# Patient Record
Sex: Female | Born: 1990 | Race: White | Hispanic: No | Marital: Married | Smoking: Never smoker
Health system: Southern US, Community
[De-identification: ages and names within clinical notes are randomized; demographics above are authoritative.]

## PROBLEM LIST (undated history)

## (undated) DIAGNOSIS — F419 Anxiety disorder, unspecified: Secondary | ICD-10-CM

---

## 2008-09-24 ENCOUNTER — Emergency Department: Payer: Self-pay | Admitting: Unknown Physician Specialty

## 2009-04-22 ENCOUNTER — Observation Stay: Payer: Self-pay

## 2009-04-27 ENCOUNTER — Inpatient Hospital Stay: Payer: Self-pay

## 2012-12-29 ENCOUNTER — Emergency Department: Payer: Self-pay | Admitting: Emergency Medicine

## 2013-03-23 ENCOUNTER — Emergency Department: Payer: Self-pay | Admitting: Emergency Medicine

## 2013-11-02 ENCOUNTER — Observation Stay: Payer: Self-pay | Admitting: Obstetrics and Gynecology

## 2013-11-02 LAB — COMPREHENSIVE METABOLIC PANEL
ALBUMIN: 2.9 g/dL — AB (ref 3.4–5.0)
AST: 17 U/L (ref 15–37)
Alkaline Phosphatase: 82 U/L
Anion Gap: 6 — ABNORMAL LOW (ref 7–16)
BUN: 10 mg/dL (ref 7–18)
Bilirubin,Total: 0.3 mg/dL (ref 0.2–1.0)
CALCIUM: 9.2 mg/dL (ref 8.5–10.1)
CO2: 28 mmol/L (ref 21–32)
Chloride: 104 mmol/L (ref 98–107)
Creatinine: 0.48 mg/dL — ABNORMAL LOW (ref 0.60–1.30)
EGFR (African American): >60
EGFR (Non-African Amer.): >60
GLUCOSE: 85 mg/dL (ref 65–99)
Osmolality: 274 (ref 275–301)
Potassium: 3.6 mmol/L (ref 3.5–5.1)
SGPT (ALT): 25 U/L (ref 12–78)
SODIUM: 138 mmol/L (ref 136–145)
Total Protein: 7.2 g/dL (ref 6.4–8.2)

## 2013-11-02 LAB — URINALYSIS, COMPLETE
Bilirubin,UR: NEGATIVE
Blood: NEGATIVE
Glucose,UR: NEGATIVE mg/dL (ref 0–75)
Ketone: NEGATIVE
LEUKOCYTE ESTERASE: NEGATIVE
NITRITE: NEGATIVE
Ph: 7 (ref 4.5–8.0)
Protein: NEGATIVE
SPECIFIC GRAVITY: 1.015 (ref 1.003–1.030)
Squamous Epithelial: 1

## 2013-11-02 LAB — CBC WITH DIFFERENTIAL/PLATELET
Basophil #: 0.1 10*3/uL (ref 0.0–0.1)
Basophil %: 0.7 %
EOS PCT: 0.4 %
Eosinophil #: 0.1 10*3/uL (ref 0.0–0.7)
HCT: 41 % (ref 35.0–47.0)
HGB: 13.9 g/dL (ref 12.0–16.0)
Lymphocyte #: 2.2 10*3/uL (ref 1.0–3.6)
Lymphocyte %: 15.4 %
MCH: 29.5 pg (ref 26.0–34.0)
MCHC: 34 g/dL (ref 32.0–36.0)
MCV: 87 fL (ref 80–100)
MONOS PCT: 4.4 %
Monocyte #: 0.6 x10 3/mm (ref 0.2–0.9)
NEUTROS ABS: 11.2 10*3/uL — AB (ref 1.4–6.5)
NEUTROS PCT: 79.1 %
PLATELETS: 265 10*3/uL (ref 150–440)
RBC: 4.73 10*6/uL (ref 3.80–5.20)
RDW: 13.2 % (ref 11.5–14.5)
WBC: 14.1 10*3/uL — ABNORMAL HIGH (ref 3.6–11.0)

## 2013-11-02 LAB — AMYLASE: Amylase: 67 U/L (ref 25–115)

## 2013-11-02 LAB — LIPASE, BLOOD: LIPASE: 133 U/L (ref 73–393)

## 2013-12-13 ENCOUNTER — Observation Stay: Payer: Self-pay

## 2014-02-17 ENCOUNTER — Observation Stay: Payer: Self-pay

## 2014-03-04 ENCOUNTER — Inpatient Hospital Stay: Payer: Self-pay | Admitting: Obstetrics and Gynecology

## 2014-03-04 LAB — CBC WITH DIFFERENTIAL/PLATELET
Basophil #: 0 10*3/uL (ref 0.0–0.1)
Basophil %: 0.4 %
EOS PCT: 0.5 %
Eosinophil #: 0 10*3/uL (ref 0.0–0.7)
HCT: 38.1 % (ref 35.0–47.0)
HGB: 12.8 g/dL (ref 12.0–16.0)
LYMPHS ABS: 1.8 10*3/uL (ref 1.0–3.6)
LYMPHS PCT: 17.9 %
MCH: 29.4 pg (ref 26.0–34.0)
MCHC: 33.6 g/dL (ref 32.0–36.0)
MCV: 87 fL (ref 80–100)
MONO ABS: 0.6 x10 3/mm (ref 0.2–0.9)
Monocyte %: 6.5 %
NEUTROS PCT: 74.7 %
Neutrophil #: 7.3 10*3/uL — ABNORMAL HIGH (ref 1.4–6.5)
Platelet: 178 10*3/uL (ref 150–440)
RBC: 4.37 10*6/uL (ref 3.80–5.20)
RDW: 13 % (ref 11.5–14.5)
WBC: 9.8 10*3/uL (ref 3.6–11.0)

## 2014-03-04 LAB — GC/CHLAMYDIA PROBE AMP

## 2014-03-05 LAB — HEMATOCRIT: HCT: 35.8 % (ref 35.0–47.0)

## 2014-04-09 ENCOUNTER — Ambulatory Visit: Payer: Self-pay | Admitting: Surgery

## 2014-04-15 ENCOUNTER — Other Ambulatory Visit: Payer: Self-pay | Admitting: Surgery

## 2014-04-15 HISTORY — PX: CHOLECYSTECTOMY: SHX55

## 2014-04-15 LAB — HCG, QUANTITATIVE, PREGNANCY

## 2014-04-16 ENCOUNTER — Ambulatory Visit: Payer: Self-pay | Admitting: Surgery

## 2014-04-29 ENCOUNTER — Ambulatory Visit: Payer: Self-pay | Admitting: Surgery

## 2014-05-03 LAB — PATHOLOGY REPORT

## 2014-07-16 NOTE — L&D Delivery Note (Signed)
Delivery Note At 7:50 PM a viable female was delivered via Vaginal, Spontaneous Delivery (Presentation: Occiput Anterior).  APGAR: 8,9 weight  .   Placenta status: Intact, Spontaneous.  Cord: 3 vessels, No complications.  Anesthesia: Epidural  Episiotomy:  none Lacerations:  1st degree Suture Repair: 3.0 vicryl Est. Blood Loss (mL):  300cc  Mom to postpartum.  Baby to Couplet care / Skin to Skin.  78GN [id number] now s/p NSVD @ 39+1 after augmentation of labor for [facility] w/ severe features. Complete after epidural placed, desire to push. Arrived in room immediately after baby delivered and placed on mom. Delayed cord clamping. 1st degree laceration noted. Repaired with 3-0 vicryl. Placenta delivered spontaneously. 300cc EBL. Will stay on magnesium for 12 hours pp due to [facility] w/ severe features (2 severe range BPs necessitating augmentation). On review of BPs, only 2 severe range pressures and [name] wnl. Asymptomatic. OK for 12hrs. NPO after midnight for pp BTL.   Carly [name] Carly [name] 03/15/2015, 8:20 PM

## 2014-08-06 ENCOUNTER — Emergency Department: Payer: Self-pay | Admitting: Internal Medicine

## 2014-08-06 LAB — CBC
HCT: 40.4 % (ref 35.0–47.0)
HGB: 13.8 g/dL (ref 12.0–16.0)
MCH: 29.2 pg (ref 26.0–34.0)
MCHC: 34.1 g/dL (ref 32.0–36.0)
MCV: 86 fL (ref 80–100)
Platelet: 303 10*3/uL (ref 150–440)
RBC: 4.72 10*6/uL (ref 3.80–5.20)
RDW: 13.9 % (ref 11.5–14.5)
WBC: 7.3 10*3/uL (ref 3.6–11.0)

## 2014-08-06 LAB — HCG, QUANTITATIVE, PREGNANCY: Beta Hcg, Quant.: 90315 m[IU]/mL — ABNORMAL HIGH

## 2014-08-06 LAB — COMPREHENSIVE METABOLIC PANEL
ALT: 23 U/L
AST: 19 U/L (ref 15–37)
Albumin: 3.4 g/dL (ref 3.4–5.0)
Alkaline Phosphatase: 70 U/L
Anion Gap: 6 — ABNORMAL LOW (ref 7–16)
BUN: 10 mg/dL (ref 7–18)
Bilirubin,Total: 0.4 mg/dL (ref 0.2–1.0)
CHLORIDE: 107 mmol/L (ref 98–107)
Calcium, Total: 8.8 mg/dL (ref 8.5–10.1)
Co2: 26 mmol/L (ref 21–32)
Creatinine: 0.66 mg/dL (ref 0.60–1.30)
EGFR (African American): >60
Glucose: 88 mg/dL (ref 65–99)
Osmolality: 276 (ref 275–301)
Potassium: 3.9 mmol/L (ref 3.5–5.1)
Sodium: 139 mmol/L (ref 136–145)
Total Protein: 6.9 g/dL (ref 6.4–8.2)

## 2014-08-06 LAB — URINALYSIS, COMPLETE
BACTERIA: NONE SEEN
BILIRUBIN, UR: NEGATIVE
BLOOD: NEGATIVE
GLUCOSE, UR: NEGATIVE mg/dL (ref 0–75)
Ketone: NEGATIVE
Leukocyte Esterase: NEGATIVE
Nitrite: NEGATIVE
PH: 6 (ref 4.5–8.0)
Protein: NEGATIVE
RBC,UR: NONE SEEN /HPF (ref 0–5)
SPECIFIC GRAVITY: 1.002 (ref 1.003–1.030)
WBC UR: <1 /HPF (ref 0–5)

## 2014-09-22 LAB — OB RESULTS CONSOLE ABO/RH: RH TYPE: POSITIVE

## 2014-09-22 LAB — OB RESULTS CONSOLE HEPATITIS B SURFACE ANTIGEN: Hepatitis B Surface Ag: NEGATIVE

## 2014-09-22 LAB — OB RESULTS CONSOLE RUBELLA ANTIBODY, IGM: RUBELLA: IMMUNE

## 2014-09-22 LAB — OB RESULTS CONSOLE VARICELLA ZOSTER ANTIBODY, IGG: VARICELLA IGG: IMMUNE

## 2014-09-22 LAB — OB RESULTS CONSOLE ANTIBODY SCREEN: ANTIBODY SCREEN: NEGATIVE

## 2014-09-22 LAB — OB RESULTS CONSOLE HIV ANTIBODY (ROUTINE TESTING): HIV: NONREACTIVE

## 2014-11-06 NOTE — Op Note (Signed)
PATIENT NAME:  Carly [name], Carly [name] MR#:  [id number] DATE OF BIRTH:  1990/11/15  DATE OF PROCEDURE:  04/29/2014  PREOPERATIVE DIAGNOSIS: Chronic cholecystitis.   POSTOPERATIVE DIAGNOSIS:   Chronic cholecystitis.  PROCEDURE: Laparoscopic cholecystectomy.   SURGEON:  Renda RollsWilton Smith, M.D.   ANESTHESIA: General.   INDICATIONS: This 24 year old female has a history of right upper quadrant abdominal pains. Ultrasound was with nonvisualization of the gallbladder and hepatobiliary scan demonstrated no gallbladder function indicating chronic cholecystitis. Surgery was recommended for definitive treatment.   DESCRIPTION OF PROCEDURE: The patient was placed on the operating table in the supine position under general endotracheal anesthesia. The abdomen was prepared with ChloraPrep and draped in a sterile manner.   A short incision was made in the inferior aspect of the umbilicus and carried down to the deep fascia which was grasped with laryngeal hook and elevated. A Veress needle was inserted, aspirated, and irrigated with a saline solution. Next, the peritoneal cavity was inflated with carbon dioxide. The Veress needle was removed. The 10 mm cannula was inserted. The 10 mm 0 degree laparoscope was inserted to view the peritoneal cavity. The liver appeared normal. Brief survey revealed typical appearance of the intestines and stomach. Another incision was made in the epigastrium slightly to the right of the midline to introduce an 11 mm cannula. Two incisions were made in the lateral aspect of the right upper quadrant to introduce two 5 mm cannulas.   The gallbladder was found to be contracted and was retracted towards the right shoulder. The infundibulum was retracted inferiorly and laterally. The porta hepatis was inspected. The gallbladder neck was mobilized with incision of the visceral peritoneum. The cystic duct was dissected free from surrounding structures. The cystic artery was dissected free from  surrounding structures. A critical view of safety was demonstrated. An Endo Clip was placed across the cystic duct adjacent to the neck of the gallbladder. An incision was made in the cystic duct to introduce a Reddick catheter; however, the catheter would not thread.  The cystic duct appeared to be small in size and the cholangiogram was therefore not done. The Reddick catheter was removed. The cystic duct was doubly ligated with Endoclips and divided. The cystic artery was controlled with double Endoclips and divided. The gallbladder was dissected free from the liver with hook and cautery. Bleeding was very minimal. Hemostasis was subsequently intact. The gallbladder was brought out through the infraumbilical incision and was found to be contracted and firm and submitted in formalin for routine pathology. The right upper quadrant was further inspected. Hemostasis was intact. The cannulas were removed allowing carbon dioxide to escape from the peritoneal cavity. There was some oozing from the lower-most 5 mm port site and all 4  incisions were infiltrated with 0.5% Sensorcaine with epinephrine. Pressure was held over the port site. There was no bleeding point immediately beneath the skin and with time hemostasis was subsequently intact. The wounds were closed with interrupted 5-0 chromic subcuticular suture, benzoin, and Steri-Strips. Dressings were applied with paper tape. The patient tolerated surgery satisfactorily and was prepared for transfer to the recovery room.    ____________________________ Shela CommonsJ. Renda RollsWilton Smith, MD jws:jw D: 04/29/2014 13:40:40 ET T: 04/29/2014 14:34:47 ET JOB#: [id number]  cc: Adella HareJ. Wilton Smith, MD, <Dictator> Adella HareWILTON J SMITH MD ELECTRONICALLY SIGNED 05/07/2014 19:41

## 2014-11-23 NOTE — H&P (Signed)
L&D Evaluation:  History:  HPI 24 y/o [facility]2P1001 [facility] arrives last night with c/o RUQ pain after eating grilled chicken salad with ranch dressing. Vomited x1 at home after eating. Was seen 3wks ago for same sx DX Gall bladder sludge via US ( no stones present). Has been taking zantac bid and watching nutrition (avoiding greasy fried fatty irritating foods). PNC @ [facility]-seen this past week. 28 wk labs scheduled next week. FM+, denies leaking fulid, contractions or vaginal bleeding, baby is active. No Pre/e sx normal blood pressure, no visual disturbances or headache. HX pre/e IOL @ 38wks, obesity, anxiety.   Presents with abdominal pain   Patient's Medical History No Chronic Illness   Patient's Surgical History none   Medications Pre Natal Vitamins   Allergies NKDA   Social History none   Family History Non-Contributory   ROS:  ROS All systems were reviewed.  HEENT, CNS, GI, GU, Respiratory, CV, Renal and Musculoskeletal systems were found to be normal.   Exam:  Vital Signs stable   Urine Protein negative dipstick   General no apparent distress   Mental Status clear   Chest clear   Heart normal sinus rhythm   Abdomen gravid, non-tender   Estimated Fetal Weight Average for gestational age   Fundal Height appropriate   Back no CVAT   Edema no edema   Reflexes 1+   Clonus negative   Pelvic no exam   Mebranes Intact   FHT baseline 130's 140's avg variabililty with accels   Ucx absent   Skin dry   Lymph no lymphadenopathy   Impression:  Impression 27wks Gall Bladder pain   Plan:  Plan UA, EFM/NST   Comments Pain resolved after 1 Norco given, pt slept through the night well. Discussed need to watch nutrition, avoid irritating foods, continue with zantac, and follow up with [facility]. Explained balancing discomfort while planning for term delivery. Will refer to GI/General Surgery as needed. Norco 5/325 #20 to be used sparingly prn. Safety  precautions given. Knows kick counts and when to call.   Electronic Signatures: Albertina ParrLugiano, Talmage Teaster B (CNM)  (Signed 31-May-15 10:06)  Authored: L&D Evaluation   Last Updated: 31-May-15 10:06 by Albertina ParrLugiano, Erastus Bartolomei B (CNM)

## 2014-11-23 NOTE — H&P (Signed)
L&D Evaluation:  History:  HPI 23yo G2P1001 with LMP of 06/04/13 & EDD of 03/11/14 for IOL for cholecystitis and pain in the RUQ. Pt is feeling well today and no pain right now. set up for IOL by A.Lugianno, CNM. 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 No VB,decreased FM, ROM or UC's.   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  HEENT, CNS, GI, GU, Respiratory, CV, Renal and Musculoskeletal systems were found to be normal.   Exam:  Vital Signs stable  128/86 this am   General no apparent distress   Mental Status clear   Chest clear   Heart normal sinus rhythm, no murmur/gallop/rubs   Abdomen gravid, non-tender   Estimated Fetal Weight Average for gestational age   Fetal Position vtx   Back no CVAT   Reflexes 1+   Pelvic 3/80/vtx-2 very post cx   Mebranes Ruptured, clear   Description clear   FHT normal rate with no decels   Ucx absent   Skin dry   Lymph no lymphadenopathy   Impression:  Impression IUP at term admittted for IOL for cholecystitis   Plan:  Plan monitor contractions and for cervical change, GBS neg   Comments Pain in RUQ controlled today, Pt has sludge in GB. Will start Pitocin. Dr Feliberto GottronSchermerhorn aware of pt status and agrees with plan of care.   Electronic Signatures: Carly [name], Carly [name] (CNM)  (Signed 20-Aug-15 10:24)  Authored: L&D Evaluation   Last Updated: 20-Aug-15 10:24 by Carly [name], Carly [name] (CNM)

## 2015-02-23 LAB — OB RESULTS CONSOLE GC/CHLAMYDIA
Chlamydia: NEGATIVE
GC PROBE AMP, GENITAL: NEGATIVE

## 2015-02-23 LAB — OB RESULTS CONSOLE GBS: GBS: POSITIVE

## 2015-03-15 ENCOUNTER — Inpatient Hospital Stay: Payer: Medicaid Other | Admitting: Anesthesiology

## 2015-03-15 ENCOUNTER — Inpatient Hospital Stay
Admission: EM | Admit: 2015-03-15 | Discharge: 2015-03-17 | DRG: 775 | Disposition: A | Discharge status: Home / Self Care | Payer: Medicaid Other | Attending: Obstetrics and Gynecology | Admitting: Obstetrics and Gynecology

## 2015-03-15 ENCOUNTER — Encounter: Payer: Self-pay | Admitting: *Deleted

## 2015-03-15 DIAGNOSIS — Z9049 Acquired absence of other specified parts of digestive tract: Secondary | ICD-10-CM | POA: Diagnosis present

## 2015-03-15 DIAGNOSIS — Z3A39 39 weeks gestation of pregnancy: Secondary | ICD-10-CM | POA: Diagnosis present

## 2015-03-15 DIAGNOSIS — O139 Gestational [pregnancy-induced] hypertension without significant proteinuria, unspecified trimester: Secondary | ICD-10-CM | POA: Diagnosis present

## 2015-03-15 DIAGNOSIS — O1414 Severe pre-eclampsia complicating childbirth: Secondary | ICD-10-CM

## 2015-03-15 HISTORY — DX: Anxiety disorder, unspecified: F41.9

## 2015-03-15 LAB — PROTEIN / CREATININE RATIO, URINE
Creatinine, Urine: 305 mg/dL
Protein Creatinine Ratio: 0.21 mg/mg{Cre} — ABNORMAL HIGH (ref 0.00–0.15)
Total Protein, Urine: 65 mg/dL

## 2015-03-15 LAB — COMPREHENSIVE METABOLIC PANEL
ALK PHOS: 212 U/L — AB (ref 38–126)
ALT: 23 U/L (ref 14–54)
ANION GAP: 10 (ref 5–15)
AST: 23 U/L (ref 15–41)
Albumin: 2.9 g/dL — ABNORMAL LOW (ref 3.5–5.0)
BILIRUBIN TOTAL: 0.6 mg/dL (ref 0.3–1.2)
BUN: 9 mg/dL (ref 6–20)
CALCIUM: 8.9 mg/dL (ref 8.9–10.3)
CO2: 21 mmol/L — ABNORMAL LOW (ref 22–32)
CREATININE: 0.52 mg/dL (ref 0.44–1.00)
Chloride: 107 mmol/L (ref 101–111)
GFR calc non Af Amer: >60 mL/min (ref >60)
GLUCOSE: 78 mg/dL (ref 65–99)
Potassium: 3.8 mmol/L (ref 3.5–5.1)
Sodium: 138 mmol/L (ref 135–145)
TOTAL PROTEIN: 6.6 g/dL (ref 6.5–8.1)

## 2015-03-15 LAB — CBC
HEMATOCRIT: 40 % (ref 35.0–47.0)
HEMOGLOBIN: 13.4 g/dL (ref 12.0–16.0)
MCH: 28.6 pg (ref 26.0–34.0)
MCHC: 33.4 g/dL (ref 32.0–36.0)
MCV: 85.4 fL (ref 80.0–100.0)
Platelets: 151 10*3/uL (ref 150–440)
RBC: 4.69 MIL/uL (ref 3.80–5.20)
RDW: 13.9 % (ref 11.5–14.5)
WBC: 8.7 10*3/uL (ref 3.6–11.0)

## 2015-03-15 LAB — TYPE AND SCREEN
ABO/RH(D): A POS
ANTIBODY SCREEN: NEGATIVE

## 2015-03-15 LAB — ABO/RH: ABO/RH(D): A POS

## 2015-03-15 LAB — URIC ACID: Uric Acid, Serum: 5 mg/dL (ref 2.3–6.6)

## 2015-03-15 MED ORDER — EPHEDRINE SULFATE 50 MG/ML IJ SOLN
INTRAMUSCULAR | Status: AC
  Administered 2015-03-15: 15 mg
  Filled 2015-03-15: qty 1

## 2015-03-15 MED ORDER — SODIUM CHLORIDE 0.9 % IJ SOLN
INTRAMUSCULAR | Status: AC
  Filled 2015-03-15: qty 6

## 2015-03-15 MED ORDER — BUPIVACAINE HCL (PF) 0.25 % IJ SOLN
INTRAMUSCULAR | Status: AC | PRN
  Administered 2015-03-15: 4 mL

## 2015-03-15 MED ORDER — FENTANYL 2.5 MCG/ML W/ROPIVACAINE 0.2% IN NS 100 ML EPIDURAL INFUSION (ARMC-ANES): 9.0000 mL/h | EPIDURAL | Status: AC

## 2015-03-15 MED ORDER — MAGNESIUM SULFATE 4 GM/100ML IV SOLN
4.0000 g | Freq: Once | INTRAVENOUS | Status: AC
  Administered 2015-03-15: 4 g via INTRAVENOUS
  Filled 2015-03-15: qty 100

## 2015-03-15 MED ORDER — MISOPROSTOL 200 MCG PO TABS
ORAL_TABLET | ORAL | Status: AC
  Filled 2015-03-15: qty 4

## 2015-03-15 MED ORDER — PHENYLEPHRINE 40 MCG/ML (10ML) SYRINGE FOR IV PUSH (FOR BLOOD PRESSURE SUPPORT)
80.0000 ug | PREFILLED_SYRINGE | INTRAVENOUS | Status: DC | PRN
  Filled 2015-03-15: qty 2

## 2015-03-15 MED ORDER — LACTATED RINGERS IV SOLN: 500.0000 mL | INTRAVENOUS | Status: DC | PRN

## 2015-03-15 MED ORDER — DIPHENHYDRAMINE HCL 50 MG/ML IJ SOLN: 12.5000 mg | INTRAMUSCULAR | Status: DC | PRN

## 2015-03-15 MED ORDER — SODIUM CHLORIDE 0.9 % IJ SOLN
INTRAMUSCULAR | Status: AC
  Filled 2015-03-15: qty 10

## 2015-03-15 MED ORDER — OXYTOCIN 10 UNIT/ML IJ SOLN
INTRAMUSCULAR | Status: AC
  Filled 2015-03-15: qty 2

## 2015-03-15 MED ORDER — EPHEDRINE 5 MG/ML INJ
10.0000 mg | INTRAVENOUS | Status: DC | PRN
  Filled 2015-03-15: qty 2

## 2015-03-15 MED ORDER — LABETALOL HCL 5 MG/ML IV SOLN: 20.0000 mg | INTRAVENOUS | Status: DC | PRN

## 2015-03-15 MED ORDER — AMMONIA AROMATIC IN INHA
RESPIRATORY_TRACT | Status: AC
  Filled 2015-03-15: qty 10

## 2015-03-15 MED ORDER — FENTANYL 2.5 MCG/ML W/ROPIVACAINE 0.2% IN NS 100 ML EPIDURAL INFUSION (ARMC-ANES)
EPIDURAL | Status: AC
  Administered 2015-03-15: 9 mL/h via EPIDURAL
  Filled 2015-03-15: qty 100

## 2015-03-15 MED ORDER — MAGNESIUM SULFATE 50 % IJ SOLN
2.0000 g/h | INTRAVENOUS | Status: DC
  Administered 2015-03-15: 2 g/h via INTRAVENOUS
  Filled 2015-03-15: qty 80

## 2015-03-15 MED ORDER — LIDOCAINE-EPINEPHRINE (PF) 1.5 %-1:200000 IJ SOLN
INTRAMUSCULAR | Status: AC | PRN
  Administered 2015-03-15: 4 mL via EPIDURAL

## 2015-03-15 MED ORDER — PENICILLIN G POTASSIUM 5000000 UNITS IJ SOLR
5.0000 10*6.[IU] | Freq: Once | INTRAMUSCULAR | Status: AC
  Administered 2015-03-15: 5 10*6.[IU] via INTRAVENOUS
  Filled 2015-03-15: qty 5

## 2015-03-15 MED ORDER — LIDOCAINE HCL (PF) 1 % IJ SOLN
INTRAMUSCULAR | Status: AC
  Administered 2015-03-15: 2 mL via SUBCUTANEOUS
  Filled 2015-03-15: qty 30

## 2015-03-15 MED ORDER — TERBUTALINE SULFATE 1 MG/ML IJ SOLN: 0.2500 mg | Freq: Once | INTRAMUSCULAR | Status: DC | PRN

## 2015-03-15 MED ORDER — PENICILLIN G POTASSIUM 5000000 UNITS IJ SOLR
2.5000 10*6.[IU] | INTRAMUSCULAR | Status: DC
  Administered 2015-03-15: 2.5 10*6.[IU] via INTRAVENOUS
  Filled 2015-03-15 (×6): qty 2.5

## 2015-03-15 MED ORDER — OXYTOCIN 40 UNITS IN LACTATED RINGERS INFUSION - SIMPLE MED
1.0000 m[IU]/min | INTRAVENOUS | Status: DC
  Administered 2015-03-15: 1 m[IU]/min via INTRAVENOUS
  Administered 2015-03-15: 40 m[IU]/min via INTRAVENOUS
  Administered 2015-03-16: 66.667 m[IU]/min via INTRAVENOUS

## 2015-03-15 MED ORDER — LACTATED RINGERS IV SOLN
INTRAVENOUS | Status: DC
  Administered 2015-03-15 (×2): via INTRAVENOUS

## 2015-03-15 MED ORDER — OXYTOCIN 40 UNITS IN LACTATED RINGERS INFUSION - SIMPLE MED
INTRAVENOUS | Status: AC
  Administered 2015-03-15: 1 m[IU]/min via INTRAVENOUS
  Filled 2015-03-15: qty 1000

## 2015-03-15 MED ORDER — CALCIUM GLUCONATE 10 % IV SOLN
INTRAVENOUS | Status: AC
  Filled 2015-03-15: qty 10

## 2015-03-15 NOTE — H&P (Signed)
Carly [name] is a 24 y.o. female presenting for elevated BP in office 120's / 100's . Mild h/a . Up on L+D diastolic BP 111.   PIH in first pregnancy . EGA 39+1 weeks  History OB History    Gravida Para Term Preterm AB TAB SAB Ectopic Multiple Living   Past Medical History  Diagnosis Date  . Anxiety    Past Surgical History  Procedure Laterality Date  . Cholecystectomy  October 2015   Family History: family history is not on file. Social History:  reports that she has never smoked. She does not have any smokeless tobacco history on file. She reports that she does not drink alcohol or use illicit drugs.   Prenatal Transfer Tool  Maternal Diabetes: No Genetic Screening: Normal, afp tetra . Too late for first trimester testing  Maternal Ultrasounds/Referrals: Normal Fetal Ultrasounds or other Referrals:  None Maternal Substance Abuse:  No Significant Maternal Medications:  None Significant Maternal Lab Results:  None Other Comments:  None  ROS unremarkable     Blood pressure 146/111, pulse 101, height  (1.626 m), weight 225 lb (102.059 kg). Exam  Lungs CTA  No rales  CV rrr without murmur adbsoft NT  Gravid  CX 3 cm / 60 /-2 /vtx  NST : 140's  + accels , no decels . No CTX  Physical Exam  Prenatal labs: ABO, Rh: --/--/A POS (08/30 1111) Antibody: NEG (08/30 1111) Rubella: Immune (03/09 0000) RPR:    HBsAg: Negative (03/09 0000)  HIV: Non-reactive (03/09 0000)  GBS: Positive (08/10 0000)   Assessment/Plan: PIH with severe BP criteria  Induce labor with Pitocin . Magnesium sulfate 4 gm load and 2 gm / hr  PCN  GBS prophylaxis    Carly [name] 03/15/2015, 1:35 PM

## 2015-03-15 NOTE — Anesthesia Procedure Notes (Signed)
Epidural Patient location during procedure: OB  Preanesthetic Checklist Completed: patient identified, site marked, surgical consent, pre-op evaluation, timeout performed, IV checked, risks and benefits discussed and monitors and equipment checked  Epidural Patient position: sitting Prep: Betadine Patient monitoring: heart rate, continuous pulse ox and blood pressure Approach: midline Location: L4-L5 Injection technique: LOR air  Needle:  Needle type: Tuohy  Needle gauge: 18 G Needle length: 9 cm and 9 Needle insertion depth: 5 cm Catheter type: closed end flexible Catheter size: 20 Guage Catheter at skin depth: 10 cm Test dose: negative and 1.5% lidocaine with Epi 1:200 K  Assessment Sensory level: T10 Events: blood not aspirated, injection not painful, no injection resistance, negative IV test and no paresthesia  Additional Notes Pt's history reviewed and consent obtained as per OB consent Patient tolerated the insertion well without complications. Negative SATD, negative IVTD All VSS were obtained and monitored through OBIX and nursing protocols followed.Reason for block:procedure for pain   

## 2015-03-15 NOTE — Anesthesia Preprocedure Evaluation (Signed)
Anesthesia Evaluation  Patient identified by MRN, date of birth, ID band Patient awake    Reviewed: Allergy & Precautions, H&P , NPO status , Patient's Chart, lab work & pertinent test results, reviewed documented beta blocker date and time   Airway Mallampati: III  TM Distance: >3 FB Neck ROM: full    Dental no notable dental hx. (+) Teeth Intact   Pulmonary neg pulmonary ROS,  breath sounds clear to auscultation  Pulmonary exam normal       Cardiovascular Exercise Tolerance: Good negative cardio ROS Normal cardiovascular examRhythm:regular Rate:Normal     Neuro/Psych negative neurological ROS  negative psych ROS   GI/Hepatic negative GI ROS, Neg liver ROS,   Endo/Other  negative endocrine ROS  Renal/GU negative Renal ROS  negative genitourinary   Musculoskeletal   Abdominal   Peds  Hematology negative hematology ROS (+)   Anesthesia Other Findings   Reproductive/Obstetrics (+) Pregnancy                             Anesthesia Physical Anesthesia Plan  ASA: II and emergent  Anesthesia Plan: Regional and Epidural   Post-op Pain Management:    Induction:   Airway Management Planned:   Additional Equipment:   Intra-op Plan:   Post-operative Plan:   Informed Consent: I have reviewed the patients History and Physical, chart, labs and discussed the procedure including the risks, benefits and alternatives for the proposed anesthesia with the patient or authorized representative who has indicated his/her understanding and acceptance.     Plan Discussed with: CRNA  Anesthesia Plan Comments:         Anesthesia Quick Evaluation

## 2015-03-15 NOTE — Progress Notes (Signed)
Patient ID: Carly [name]  DOB: January 28, 1991, 24 y.o.   MRN: [id number] Transient fetal bradycardia at 1520 secondary to low BP s/p Magnesium bolus .  Currently on 2 gm / he . Pitocin at 80mu/min  Ctx q 5-8 minutes . Reassurring fetal monitoring now  BP 150/90's CX AROM - clear . 4cm/ 80 /-1 . FSE placed  Second dose on PCN soon . Anticipate SVD . Dr Tildon Husky coming on at 1900

## 2015-03-15 NOTE — Progress Notes (Signed)
15 mg ephedrine given per Dr. Pernell Dupre

## 2015-03-16 ENCOUNTER — Inpatient Hospital Stay: Payer: Medicaid Other | Admitting: *Deleted

## 2015-03-16 ENCOUNTER — Encounter
Admission: EM | Disposition: A | Discharge status: Home / Self Care | Payer: Self-pay | Source: Home / Self Care | Attending: Obstetrics and Gynecology

## 2015-03-16 HISTORY — PX: TUBAL LIGATION: SHX77

## 2015-03-16 LAB — COMPREHENSIVE METABOLIC PANEL
ALBUMIN: 2.3 g/dL — AB (ref 3.5–5.0)
ALT: 27 U/L (ref 14–54)
ANION GAP: 6 (ref 5–15)
AST: 29 U/L (ref 15–41)
Alkaline Phosphatase: 172 U/L — ABNORMAL HIGH (ref 38–126)
BILIRUBIN TOTAL: 0.7 mg/dL (ref 0.3–1.2)
BUN: 6 mg/dL (ref 6–20)
CHLORIDE: 104 mmol/L (ref 101–111)
CO2: 24 mmol/L (ref 22–32)
Calcium: 7.1 mg/dL — ABNORMAL LOW (ref 8.9–10.3)
Creatinine, Ser: 0.53 mg/dL (ref 0.44–1.00)
GFR calc Af Amer: >60 mL/min (ref >60)
Glucose, Bld: 90 mg/dL (ref 65–99)
POTASSIUM: 3.9 mmol/L (ref 3.5–5.1)
Sodium: 134 mmol/L — ABNORMAL LOW (ref 135–145)
TOTAL PROTEIN: 5.5 g/dL — AB (ref 6.5–8.1)

## 2015-03-16 LAB — CBC
HEMATOCRIT: 37.1 % (ref 35.0–47.0)
HEMOGLOBIN: 12.7 g/dL (ref 12.0–16.0)
MCH: 29.1 pg (ref 26.0–34.0)
MCHC: 34.1 g/dL (ref 32.0–36.0)
MCV: 85.3 fL (ref 80.0–100.0)
Platelets: 154 10*3/uL (ref 150–440)
RBC: 4.35 MIL/uL (ref 3.80–5.20)
RDW: 14.1 % (ref 11.5–14.5)
WBC: 11.9 10*3/uL — AB (ref 3.6–11.0)

## 2015-03-16 LAB — RPR: RPR Ser Ql: NONREACTIVE

## 2015-03-16 SURGERY — LIGATION, FALLOPIAN TUBE, POSTPARTUM
Anesthesia: General

## 2015-03-16 MED ORDER — LACTATED RINGERS IV SOLN
INTRAVENOUS | Status: DC | PRN
  Administered 2015-03-16: 14:00:00 via INTRAVENOUS

## 2015-03-16 MED ORDER — IBUPROFEN 600 MG PO TABS
600.0000 mg | ORAL_TABLET | Freq: Four times a day (QID) | ORAL | Status: DC
  Administered 2015-03-16 – 2015-03-17 (×3): 600 mg via ORAL
  Filled 2015-03-16 (×2): qty 1

## 2015-03-16 MED ORDER — WITCH HAZEL-GLYCERIN EX PADS: 1.0000 "application " | MEDICATED_PAD | CUTANEOUS | Status: DC | PRN

## 2015-03-16 MED ORDER — ZOLPIDEM TARTRATE 5 MG PO TABS: 5.0000 mg | ORAL_TABLET | Freq: Every evening | ORAL | Status: DC | PRN

## 2015-03-16 MED ORDER — PRENATAL MULTIVITAMIN CH
1.0000 | ORAL_TABLET | Freq: Every day | ORAL | Status: DC
Start: 2015-03-16 — End: 2015-03-17
  Administered 2015-03-17: 1 via ORAL
  Filled 2015-03-16: qty 1

## 2015-03-16 MED ORDER — ROCURONIUM BROMIDE 100 MG/10ML IV SOLN
INTRAVENOUS | Status: DC | PRN
Start: 2015-03-16 — End: 2015-03-16
  Administered 2015-03-16: 20 mg via INTRAVENOUS

## 2015-03-16 MED ORDER — MIDAZOLAM HCL 2 MG/2ML IJ SOLN
INTRAMUSCULAR | Status: DC | PRN
  Administered 2015-03-16: 1 mg via INTRAVENOUS

## 2015-03-16 MED ORDER — FENTANYL CITRATE (PF) 100 MCG/2ML IJ SOLN
INTRAMUSCULAR | Status: DC | PRN
  Administered 2015-03-16: 100 ug via INTRAVENOUS
  Administered 2015-03-16: 50 ug via INTRAVENOUS

## 2015-03-16 MED ORDER — GLYCOPYRROLATE 0.2 MG/ML IJ SOLN
INTRAMUSCULAR | Status: DC | PRN
  Administered 2015-03-16: 0.4 mg via INTRAVENOUS

## 2015-03-16 MED ORDER — SENNOSIDES-DOCUSATE SODIUM 8.6-50 MG PO TABS
2.0000 | ORAL_TABLET | ORAL | Status: DC
  Administered 2015-03-17: 2 via ORAL
  Filled 2015-03-16: qty 2

## 2015-03-16 MED ORDER — BUPIVACAINE HCL 0.5 % IJ SOLN
INTRAMUSCULAR | Status: DC | PRN
  Administered 2015-03-16: 1 mL

## 2015-03-16 MED ORDER — NEOSTIGMINE METHYLSULFATE 10 MG/10ML IV SOLN
INTRAVENOUS | Status: DC | PRN
Start: 2015-03-16 — End: 2015-03-16
  Administered 2015-03-16: 2 mg via INTRAVENOUS

## 2015-03-16 MED ORDER — ONDANSETRON HCL 4 MG/2ML IJ SOLN
INTRAMUSCULAR | Status: DC | PRN
Start: 2015-03-16 — End: 2015-03-16
  Administered 2015-03-16: 4 mg via INTRAVENOUS

## 2015-03-16 MED ORDER — TETANUS-DIPHTH-ACELL PERTUSSIS 5-2.5-18.5 LF-MCG/0.5 IM SUSP: 0.5000 mL | Freq: Once | INTRAMUSCULAR | Status: DC

## 2015-03-16 MED ORDER — SIMETHICONE 80 MG PO CHEW: 80.0000 mg | CHEWABLE_TABLET | ORAL | Status: DC | PRN

## 2015-03-16 MED ORDER — SODIUM CHLORIDE 0.9 % IJ SOLN: 3.0000 mL | Freq: Two times a day (BID) | INTRAMUSCULAR | Status: DC

## 2015-03-16 MED ORDER — DIPHENHYDRAMINE HCL 25 MG PO CAPS: 25.0000 mg | ORAL_CAPSULE | Freq: Four times a day (QID) | ORAL | Status: DC | PRN

## 2015-03-16 MED ORDER — SODIUM CHLORIDE 0.9 % IV SOLN
250.0000 mL | INTRAVENOUS | Status: DC | PRN
  Administered 2015-03-16: 250 mL via INTRAVENOUS

## 2015-03-16 MED ORDER — ONDANSETRON HCL 4 MG/2ML IJ SOLN: 4.0000 mg | INTRAMUSCULAR | Status: DC | PRN

## 2015-03-16 MED ORDER — DIBUCAINE 1 % RE OINT: 1.0000 "application " | TOPICAL_OINTMENT | RECTAL | Status: DC | PRN

## 2015-03-16 MED ORDER — METOCLOPRAMIDE HCL 5 MG/ML IJ SOLN: 10.0000 mg | Freq: Once | INTRAMUSCULAR | Status: DC | PRN

## 2015-03-16 MED ORDER — ONDANSETRON HCL 4 MG PO TABS: 4.0000 mg | ORAL_TABLET | ORAL | Status: DC | PRN

## 2015-03-16 MED ORDER — BENZOCAINE-MENTHOL 20-0.5 % EX AERO: 1.0000 "application " | INHALATION_SPRAY | CUTANEOUS | Status: DC | PRN

## 2015-03-16 MED ORDER — SODIUM CHLORIDE 0.9 % IJ SOLN: 3.0000 mL | INTRAMUSCULAR | Status: DC | PRN

## 2015-03-16 MED ORDER — ACETAMINOPHEN 325 MG PO TABS: 650.0000 mg | ORAL_TABLET | ORAL | Status: DC | PRN

## 2015-03-16 MED ORDER — SUCCINYLCHOLINE CHLORIDE 20 MG/ML IJ SOLN
INTRAMUSCULAR | Status: DC | PRN
  Administered 2015-03-16: 100 mg via INTRAVENOUS

## 2015-03-16 MED ORDER — PROPOFOL 10 MG/ML IV BOLUS
INTRAVENOUS | Status: DC | PRN
  Administered 2015-03-16: 200 mg via INTRAVENOUS

## 2015-03-16 MED ORDER — OXYTOCIN 40 UNITS IN LACTATED RINGERS INFUSION - SIMPLE MED
INTRAVENOUS | Status: AC
  Administered 2015-03-16: 66.667 m[IU]/min via INTRAVENOUS
  Filled 2015-03-16: qty 1000

## 2015-03-16 MED ORDER — OXYCODONE-ACETAMINOPHEN 5-325 MG PO TABS
1.0000 | ORAL_TABLET | ORAL | Status: DC | PRN
  Filled 2015-03-16: qty 1

## 2015-03-16 MED ORDER — LANOLIN HYDROUS EX OINT: TOPICAL_OINTMENT | CUTANEOUS | Status: DC | PRN

## 2015-03-16 MED ORDER — IBUPROFEN 600 MG PO TABS
ORAL_TABLET | ORAL | Status: AC
  Administered 2015-03-16: 600 mg via ORAL
  Filled 2015-03-16: qty 1

## 2015-03-16 MED ORDER — BUPIVACAINE HCL (PF) 0.5 % IJ SOLN
INTRAMUSCULAR | Status: AC
  Filled 2015-03-16: qty 30

## 2015-03-16 MED ORDER — DEXAMETHASONE SODIUM PHOSPHATE 4 MG/ML IJ SOLN
INTRAMUSCULAR | Status: DC | PRN
  Administered 2015-03-16: 10 mg via INTRAVENOUS

## 2015-03-16 MED ORDER — OXYCODONE-ACETAMINOPHEN 5-325 MG PO TABS
1.0000 | ORAL_TABLET | Freq: Four times a day (QID) | ORAL | Status: DC | PRN
  Administered 2015-03-16: 1 via ORAL

## 2015-03-16 MED ORDER — FENTANYL CITRATE (PF) 100 MCG/2ML IJ SOLN: 25.0000 ug | INTRAMUSCULAR | Status: DC | PRN

## 2015-03-16 MED ORDER — OXYCODONE-ACETAMINOPHEN 5-325 MG PO TABS: 2.0000 | ORAL_TABLET | ORAL | Status: DC | PRN

## 2015-03-16 MED ORDER — SODIUM CHLORIDE 0.9 % IR SOLN
Status: DC | PRN
  Administered 2015-03-16: 1 mL

## 2015-03-16 SURGICAL SUPPLY — 28 items
APPLICATOR COTTON TIP 6IN STRL (MISCELLANEOUS) ×2 IMPLANT
BLADE SURG SZ11 CARB STEEL (BLADE) ×2 IMPLANT
CANISTER SUCT 1200ML W/VALVE (MISCELLANEOUS) ×2 IMPLANT
CHLORAPREP W/TINT 26ML (MISCELLANEOUS) ×2 IMPLANT
DRAPE LAPAROTOMY 100X77 ABD (DRAPES) ×2 IMPLANT
DRSG TEGADERM 2-3/8X2-3/4 SM (GAUZE/BANDAGES/DRESSINGS) ×2 IMPLANT
DRSG TEGADERM 4X4.75 (GAUZE/BANDAGES/DRESSINGS) ×2 IMPLANT
GAUZE SPONGE NON-WVN 2X2 STRL (MISCELLANEOUS) ×1 IMPLANT
GLOVE BIO SURGEON STRL SZ8 (GLOVE) ×2 IMPLANT
GOWN STRL REUS W/ TWL LRG LVL3 (GOWN DISPOSABLE) ×1 IMPLANT
GOWN STRL REUS W/ TWL XL LVL3 (GOWN DISPOSABLE) ×1 IMPLANT
GOWN STRL REUS W/TWL LRG LVL3 (GOWN DISPOSABLE) ×1
GOWN STRL REUS W/TWL XL LVL3 (GOWN DISPOSABLE) ×1
KIT RM TURNOVER STRD PROC AR (KITS) ×2 IMPLANT
LABEL OR SOLS (LABEL) IMPLANT
LIQUID BAND (GAUZE/BANDAGES/DRESSINGS) ×2 IMPLANT
NEEDLE HYPO 25X1 1.5 SAFETY (NEEDLE) ×2 IMPLANT
NS IRRIG 500ML POUR BTL (IV SOLUTION) ×2 IMPLANT
PACK BASIN MINOR ARMC (MISCELLANEOUS) ×2 IMPLANT
PAD GROUND ADULT SPLIT (MISCELLANEOUS) ×2 IMPLANT
SPONGE VERSALON 2X2 STRL (MISCELLANEOUS) ×1
STRIP CLOSURE SKIN 1/4X4 (GAUZE/BANDAGES/DRESSINGS) ×2 IMPLANT
SUT PLAIN GUT 0 (SUTURE) ×4 IMPLANT
SUT VIC AB 2-0 UR6 27 (SUTURE) ×2 IMPLANT
SUT VIC AB 4-0 SH 27 (SUTURE) ×1
SUT VIC AB 4-0 SH 27XANBCTRL (SUTURE) ×1 IMPLANT
SWABSTK COMLB BENZOIN TINCTURE (MISCELLANEOUS) ×2 IMPLANT
SYRINGE 10CC LL (SYRINGE) ×2 IMPLANT

## 2015-03-16 NOTE — Anesthesia Postprocedure Evaluation (Signed)
  Anesthesia Post-op Note  Patient: Carly [name]  Procedure(s) Performed: Procedure(s): POST PARTUM TUBAL LIGATION (N/A)  Anesthesia type:General  Patient location: PACU  Post pain: Pain level controlled  Post assessment: Post-op Vital signs reviewed, Patient's Cardiovascular Status Stable, Respiratory Function Stable, Patent Airway and No signs of Nausea or vomiting  Post vital signs: Reviewed and stable  Last Vitals:  Filed Vitals:   03/16/15 1445  BP: 138/80  Pulse: 110  Temp:   Resp: 19    Level of consciousness: awake, alert  and patient cooperative  Complications: No apparent anesthesia complications

## 2015-03-16 NOTE — Anesthesia Postprocedure Evaluation (Signed)
  Anesthesia Post-op Note  Patient: Carly [name]  Procedure(s) Performed: Procedure(s): POST PARTUM TUBAL LIGATION (N/A)  Anesthesia type:General  Patient location: PACU  Post pain: Pain level controlled  Post assessment: Post-op Vital signs reviewed, Patient's Cardiovascular Status Stable, Respiratory Function Stable, Patent Airway and No signs of Nausea or vomiting  Post vital signs: Reviewed and stable  Last Vitals:  Filed Vitals:   03/16/15 1445  BP: 138/80  Pulse: 110  Temp:   Resp: 19    Level of consciousness: awake, alert  and patient cooperative  Complications: No apparent anesthesia complications  

## 2015-03-16 NOTE — Op Note (Signed)
NAMEJAYANNA, KROEGER NO.:  [id number]  MEDICAL RECORD NO.:  [id number]  LOCATION:  340A                         FACILITY:  [facility]  PHYSICIAN:  Jennell Corner, MDDATE OF BIRTH:  1991/05/17  DATE OF PROCEDURE: DATE OF DISCHARGE:                              OPERATIVE REPORT   PREOPERATIVE DIAGNOSIS:  Elective permanent sterilization.  POSTOPERATIVE DIAGNOSIS:  Elective permanent sterilization.  PROCEDURE:  Postpartum bilateral tubal ligation, [place].  ANESTHESIA:  General endotracheal anesthesia.  SURGEONS:  Jennell Corner, M.D.  INDICATIONS:  A 24 year old, gravida 3, now para 3, status post uncomplicated spontaneous vaginal delivery, the night before the procedure.  The patient reconfirms the desire for permanent sterilization.  The patient understands the failure rate from tubal ligation of 1/200 per year.  DESCRIPTION OF PROCEDURE:  After adequate general endotracheal anesthesia, the patient was placed in dorsal supine position.  Abdomen was prepped and draped in normal sterile fashion.  A 15 mm infraumbilical incision was made after injecting with 0.5% Marcaine. The fascia was grasped with Kelly clamps and opened sharply as well as the peritoneal, which was opened sharply.  Attention was directed to the patient's right fallopian tube, which was grasped with a Babcock clamp. The fimbriated end was visualized and 2 separate 0 plain gut sutures were placed in the midportion of the fallopian tube and a 1.5 cm portion of the fallopian tube was removed.  Good hemostasis was noted.  Similar procedure was repeated on the patient's left side.  After identifying the left fallopian tube in the fimbriated end.  Two separate 0 plain gut sutures were applied at the midportion of the fallopian tube and a 1.5 cm portion of the fallopian tube was removed.  Good hemostasis was noticed.  The fascia was then closed with 2-0 Vicryl in a running, nonlocking  fashion.  The skin was reapproximated with interrupted 4-0 Vicryl suture.  Liquid band was applied to the skin and a Tegaderm.  A Tegaderm was applied.  ESTIMATED BLOOD LOSS:  Minimal.  INTRAOPERATIVE FLUID:  200 mL.  The patient tolerated the procedure well and was taken to the recovery room in good condition.          ______________________________ Jennell Corner, MD     TS/MEDQ  D:  03/16/2015  T:  03/16/2015  Job:  [id number]

## 2015-03-16 NOTE — Progress Notes (Signed)
Post Partum Day 1 Subjective: no complaints    Pt desires  BTL today  Objective: Blood pressure 132/93, pulse 91, temperature 97.8 F (36.6 C), temperature source Oral, resp. rate 18, height  (1.626 m), weight 102.059 kg (225 lb), SpO2 100 %.  Physical Exam:  General: alert and cooperative Lochia: appropriate  LUND cta  cv RRR  Uterine Fundus: firm Incision: n/a DVT Evaluation: No evidence of DVT seen on physical exam. Good urine output > 150 cc/ hr   Recent Labs  03/15/15 1112 03/16/15 0617  HGB 13.4 12.7  HCT 40.0 37.1    Assessment/Plan: Contraception stable , PP BTL today    LOS: 1 day   Carly [name] 03/16/2015, 9:36 AM

## 2015-03-16 NOTE — Transfer of Care (Signed)
Immediate Anesthesia Transfer of Care Note  Patient: Carly [name]  Procedure(s) Performed: Procedure(s): POST PARTUM TUBAL LIGATION (N/A)  Patient Location: PACU  Anesthesia Type:General  Level of Consciousness: patient cooperative and lethargic  Airway & Oxygen Therapy: Patient Spontanous Breathing and Patient connected to face mask oxygen  Post-op Assessment: Report given to RN and Post -op Vital signs reviewed and stable  Post vital signs: Reviewed and stable  Last Vitals:  Filed Vitals:   03/16/15 1445  BP: 138/80  Pulse: 110  Temp:   Resp: 19    Complications: No apparent anesthesia complications

## 2015-03-16 NOTE — Anesthesia Preprocedure Evaluation (Signed)
Anesthesia Evaluation  Patient identified by MRN, date of birth, ID band Patient awake    Reviewed: Allergy & Precautions, NPO status , Patient's Chart, lab work & pertinent test results  History of Anesthesia Complications (+) PONV  Airway Mallampati: I  TM Distance: >3 FB Neck ROM: Full    Dental  (+) Teeth Intact   Pulmonary  breath sounds clear to auscultation  Pulmonary exam normal       Cardiovascular Exercise Tolerance: Good hypertension, Pt. on medications Normal cardiovascular examRhythm:Regular Rate:Normal  Was pre-eclamptic on admission, was induced and delivered and has done well since.   Neuro/Psych    GI/Hepatic   Endo/Other    Renal/GU      Musculoskeletal   Abdominal   Peds  Hematology   Anesthesia Other Findings   Reproductive/Obstetrics Delivered yesterday.                             Anesthesia Physical Anesthesia Plan  ASA: II  Anesthesia Plan: General   Post-op Pain Management:    Induction: Intravenous  Airway Management Planned: Oral ETT  Additional Equipment:   Intra-op Plan:   Post-operative Plan: Extubation in OR  Informed Consent: I have reviewed the patients History and Physical, chart, labs and discussed the procedure including the risks, benefits and alternatives for the proposed anesthesia with the patient or authorized representative who has indicated his/her understanding and acceptance.     Plan Discussed with: CRNA  Anesthesia Plan Comments:         Anesthesia Quick Evaluation

## 2015-03-16 NOTE — Anesthesia Procedure Notes (Signed)
Procedure Name: Intubation Date/Time: 03/16/2015 2:00 PM Performed by: Omer Jack Pre-anesthesia Checklist: Patient identified, Patient being monitored, Timeout performed, Emergency Drugs available and Suction available Patient Re-evaluated:Patient Re-evaluated prior to inductionOxygen Delivery Method: Circle system utilized Preoxygenation: Pre-oxygenation with 100% oxygen Intubation Type: IV induction Ventilation: Mask ventilation without difficulty Laryngoscope Size: Mac and 3 Grade View: Grade I Tube type: Oral Tube size: 7.0 mm Number of attempts: 1 Placement Confirmation: ETT inserted through vocal cords under direct vision,  positive ETCO2 and breath sounds checked- equal and bilateral Secured at: 21 cm Tube secured with: Tape Dental Injury: Teeth and Oropharynx as per pre-operative assessment

## 2015-03-16 NOTE — Progress Notes (Signed)
Talked with Dr Tildon Husky, order to dc mag and transfere to MB unit Pt for BTL today per Dr Tildon Husky

## 2015-03-16 NOTE — Progress Notes (Signed)
Patient ID: Carly [name], female   DOB: 05-07-1991, 24 y.o.   MRN: [id number] Brief op note:  preop : elective sterilization  Postop: same  Porceedure PP BTL Anesthesia : GETA  Surgeon : Meggen Spaziani Complication : none Good hemostasis , ebl : min  IOF : 200 cc Path : portion of each fallopian tube

## 2015-03-17 MED ORDER — OXYCODONE-ACETAMINOPHEN 5-325 MG PO TABS: 1.0000 | ORAL_TABLET | Freq: Four times a day (QID) | ORAL | Status: DC | PRN

## 2015-03-17 MED ORDER — IBUPROFEN 600 MG PO TABS: 600.0000 mg | ORAL_TABLET | Freq: Four times a day (QID) | ORAL | Status: AC

## 2015-03-17 NOTE — Progress Notes (Signed)
Discharge instructions reviewed with pt. Pt v/u of all instructions. ID bands of mom and infant matched. Escorted by nursing via w/c in stable condition. Ruta Hinds, RN 03/17/15 1215

## 2015-03-17 NOTE — Discharge Summary (Signed)
Obstetric Discharge Summary Reason for Admission: PIH , severe criteria Prenatal Procedures: none Intrapartum Procedures: magnesium , induction  Postpartum Procedures: P.P. tubal ligation Complications-Operative and Postpartum: none HEMOGLOBIN  Date Value Ref Range Status  03/16/2015 12.7 12.0 - 16.0 g/dL Final   HGB  Date Value Ref Range Status  08/06/2014 13.8 12.0-16.0 g/dL Final   HCT  Date Value Ref Range Status  03/16/2015 37.1 35.0 - 47.0 % Final  08/06/2014 40.4 35.0-47.0 % Final    Physical Exam:  General: alert and cooperative Lochia: appropriate Lungs cta  cv rrr Uterine Fundus: firm Incision: healing well, no significant drainage DVT Evaluation: No evidence of DVT seen on physical exam.  Discharge Diagnoses: Term Pregnancy-delivered  Discharge Information: Date: 03/17/2015 Activity: pelvic rest Diet: routine Medications: Ibuprofen and Percocet Condition: stable Instructions: refer to practice specific booklet Discharge to: home Follow-up Information    Follow up with SCHERMERHORN,THOMAS, MD In 2 weeks.   Specialty:  Obstetrics and Gynecology   Why:  For wound re-check   Contact information:   [address] [place] [address] [phone]       Newborn Data: Live born female  Birth Weight:   APGAR: ,   Home with mother.  SCHERMERHORN,THOMAS 03/17/2015, 8:35 AM

## 2015-03-18 ENCOUNTER — Encounter: Payer: Self-pay | Admitting: Obstetrics and Gynecology

## 2015-03-18 LAB — SURGICAL PATHOLOGY

## 2015-03-20 IMAGING — US ABDOMEN ULTRASOUND LIMITED
1 series · 14 of 25 positions shown · non-contrast
Comparison: None.

CLINICAL DATA: Abdominal pain and vomiting.

EXAM:
US ABDOMEN LIMITED - RIGHT UPPER QUADRANT

[Series 1: abdomen ultrasound limited · 0.27mm/px · 14 of 36 slices shown]
[im 1/36]
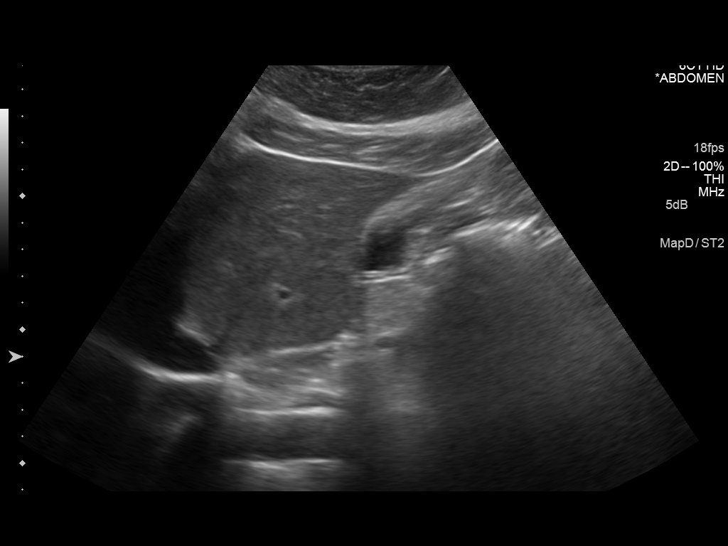
[im 3/36]
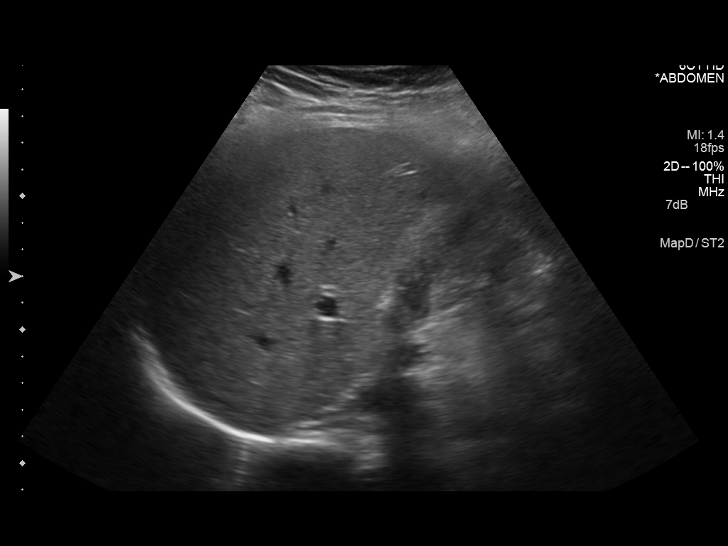
[im 6/36]
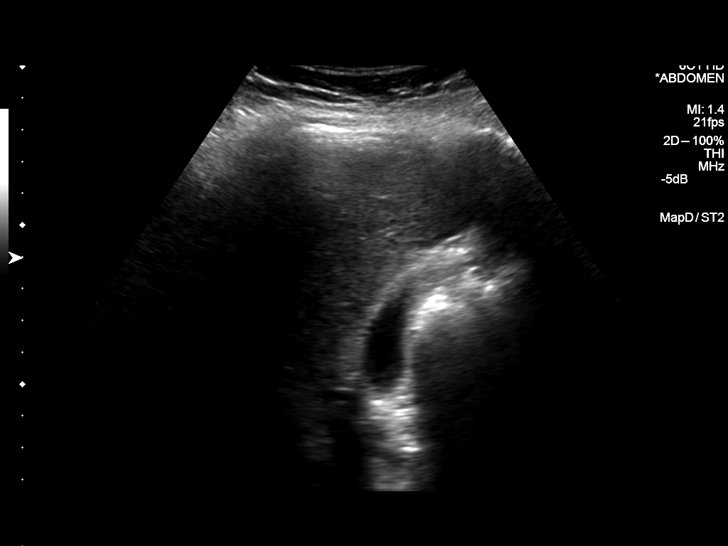
[im 9/36]
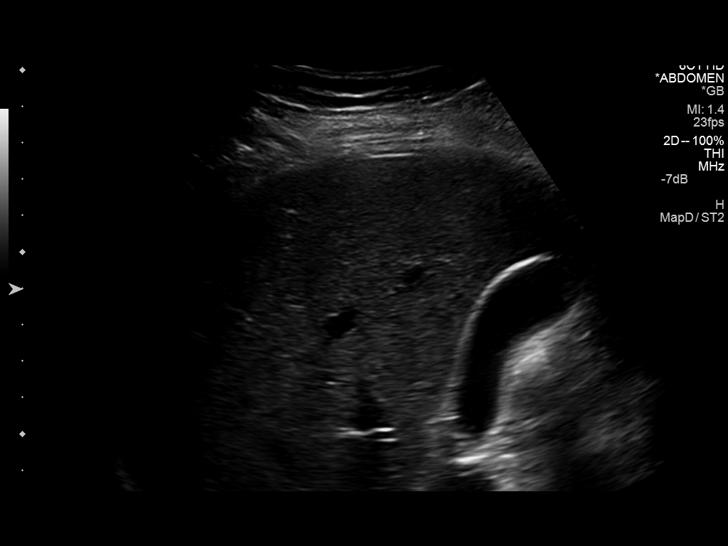
[im 12/36]
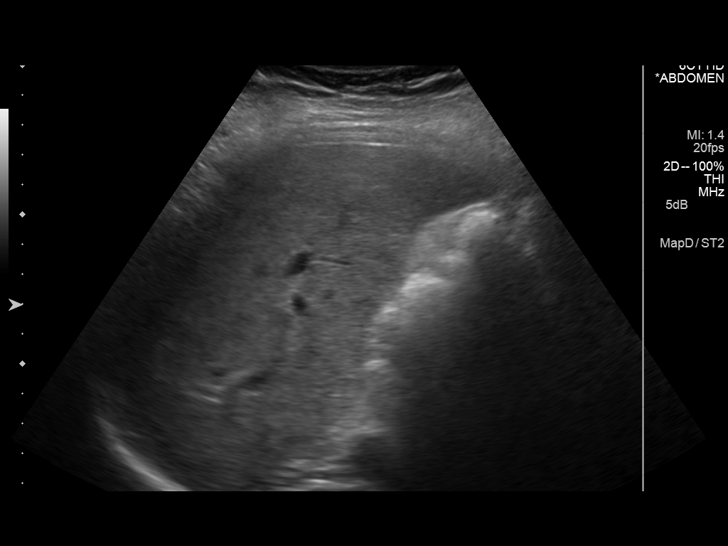
[im 14/36]
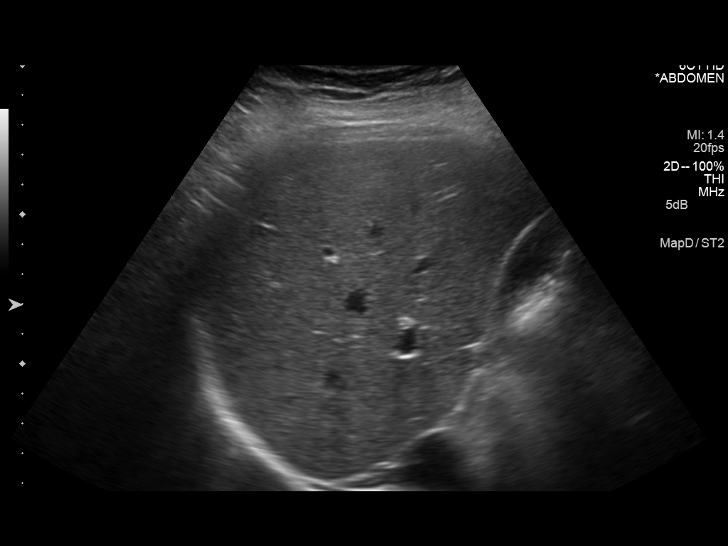
[im 17/36]
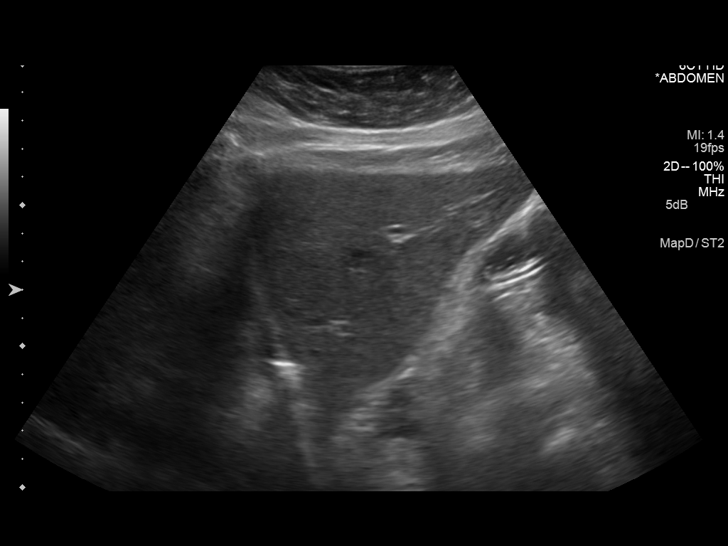
[im 19/36]
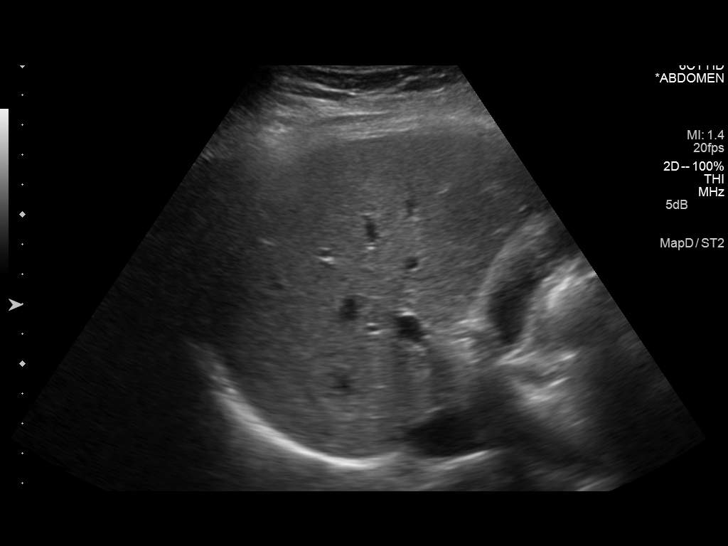
[im 22/36]
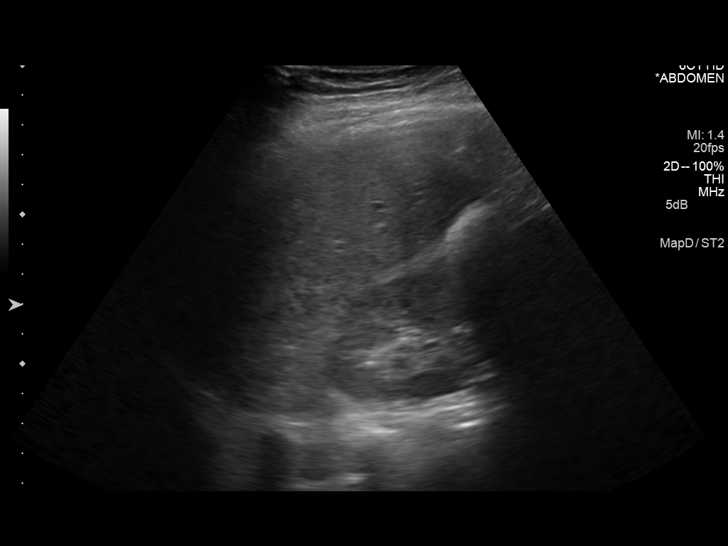
[im 24/36]
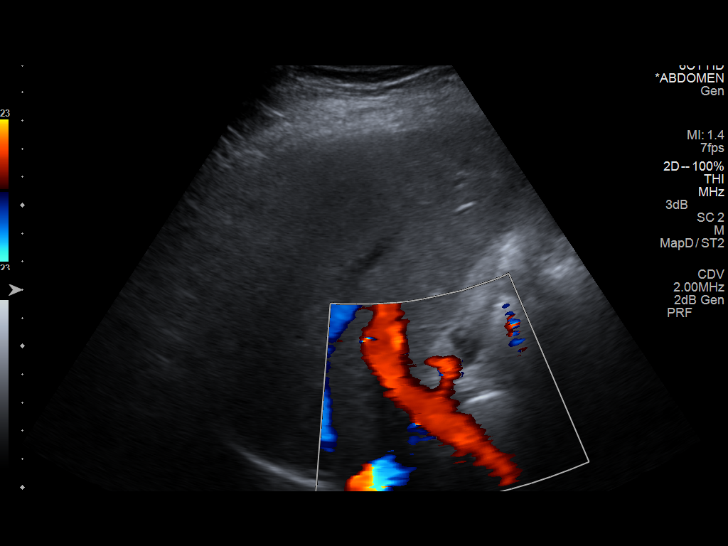
[im 27/36]
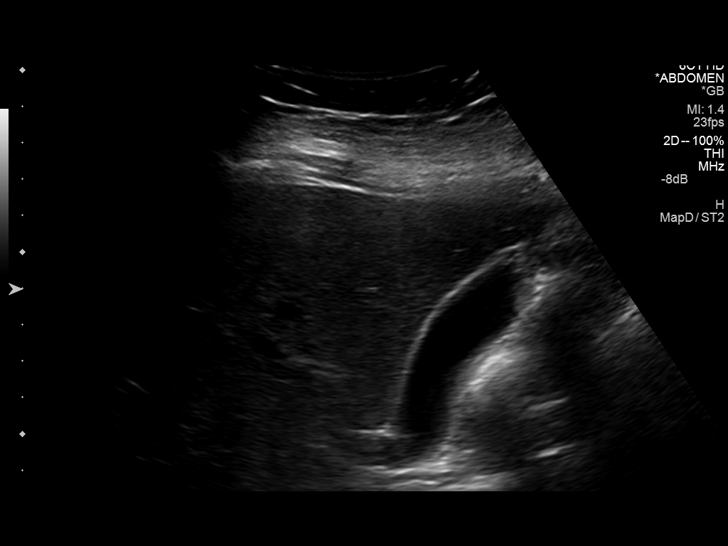
[im 30/36]
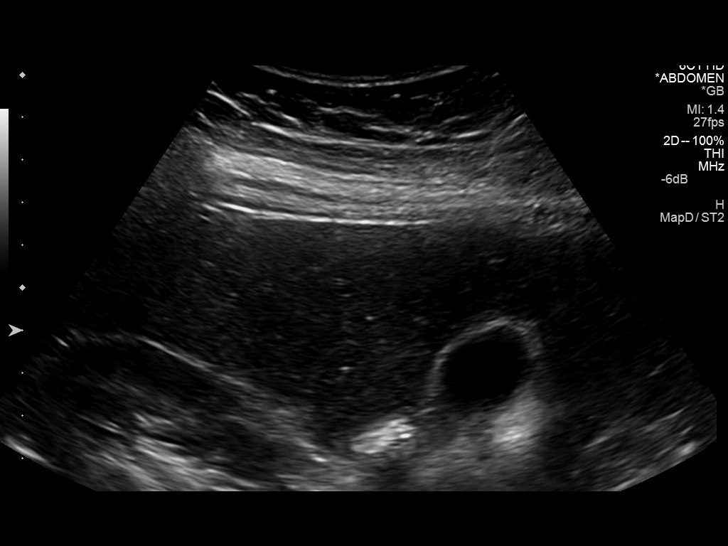
[im 33/36]
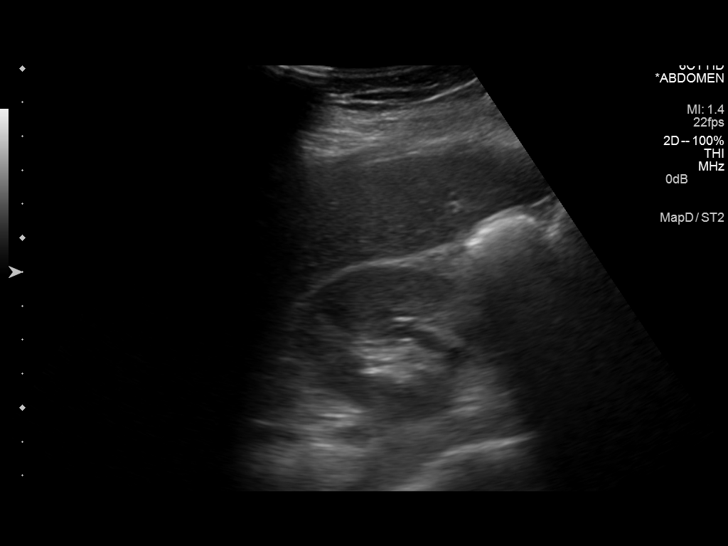
[im 36/36]
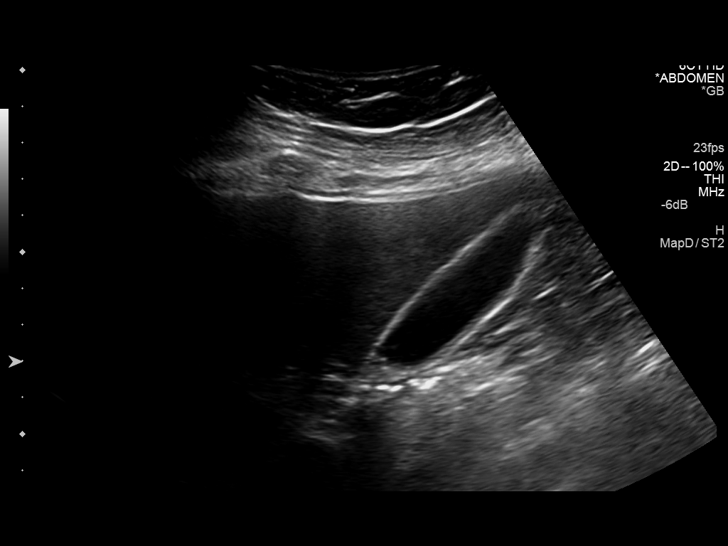

[14 of 25 positions shown; findings below may reference images not displayed]

FINDINGS: Gallbladder:

No gallstones or wall thickening visualized. No sonographic Murphy
sign noted.

Common bile duct:

Diameter: 2.5 mm, normal

Liver:

No focal lesion identified. Within normal limits in parenchymal
echogenicity.
IMPRESSION: No acute changes identified.

## 2015-08-24 IMAGING — US US GALLBLADDER-BILIARY (RUQ)
1 series · 14 of 25 positions shown · non-contrast
Comparison: 11/03/2013 ultrasound

CLINICAL DATA: 23-year-old female with right upper quadrant
abdominal pain.

EXAM:
US ABDOMEN LIMITED - RIGHT UPPER QUADRANT

[Series 1: us gallbladder-biliary (ruq) · 0.26mm/px · 14 of 59 slices shown]
[im 1/59]
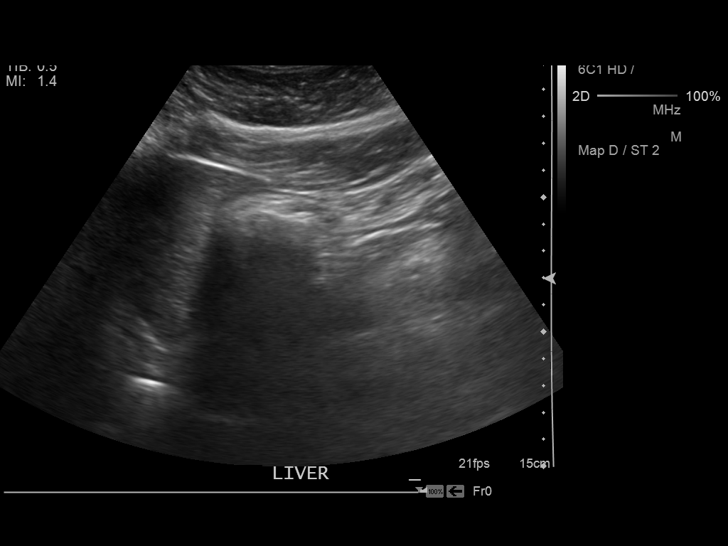
[im 5/59]
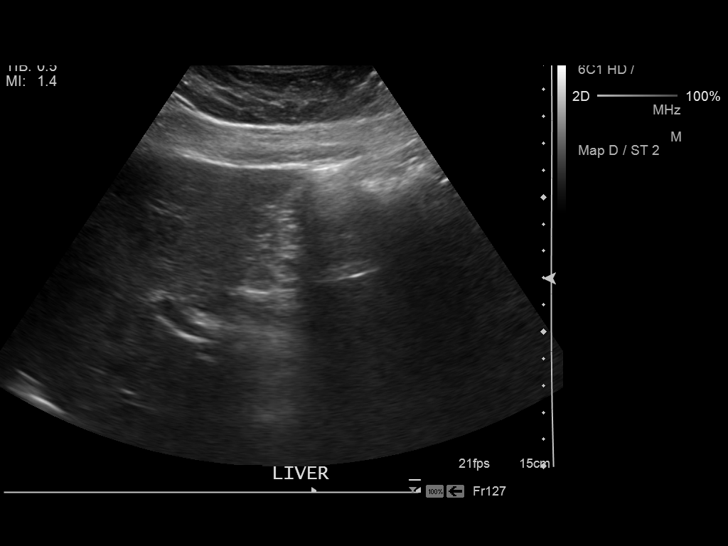
[im 10/59]
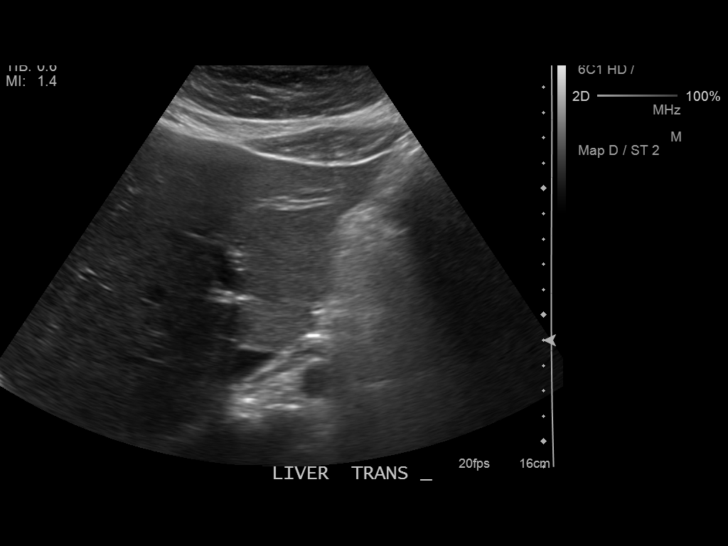
[im 15/59]
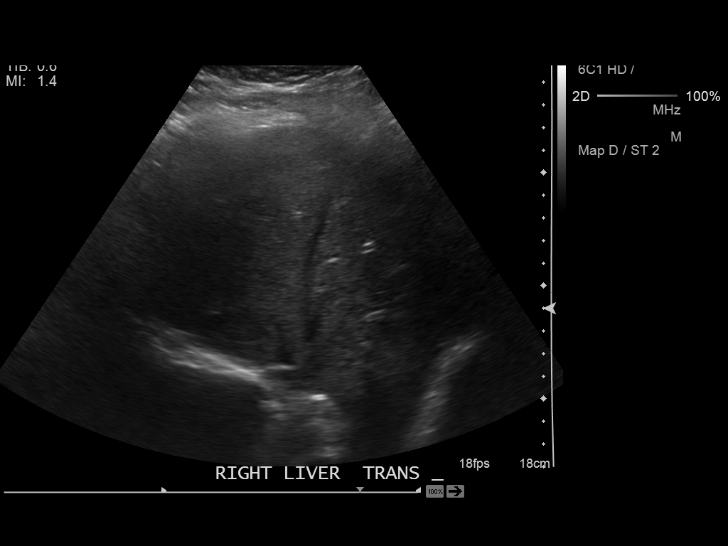
[im 20/59]
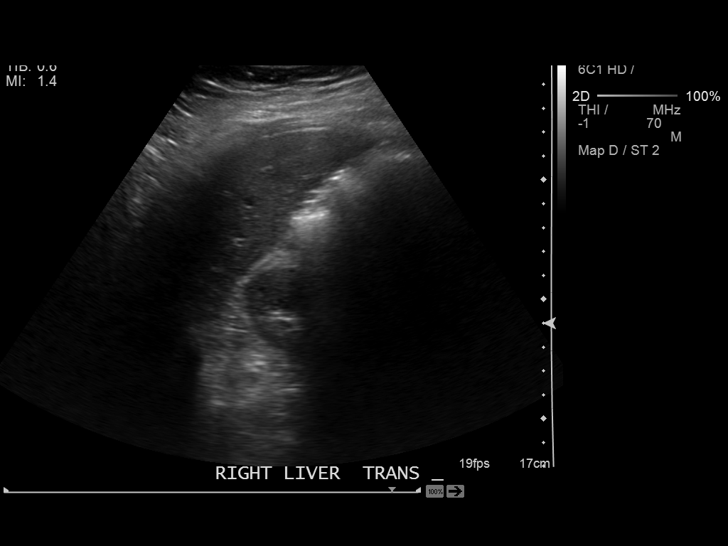
[im 22/59]
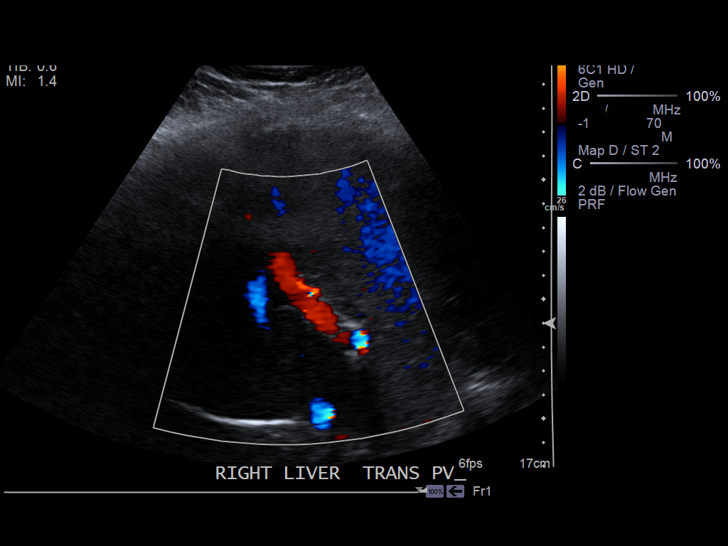
[im 27/59]
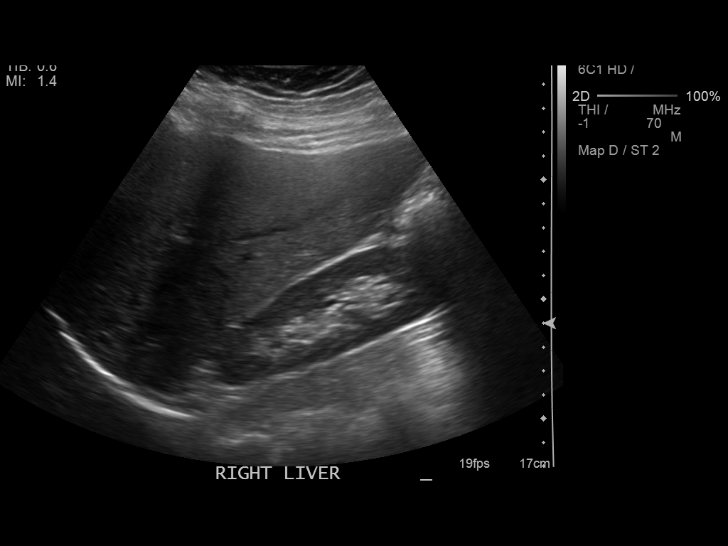
[im 32/59]
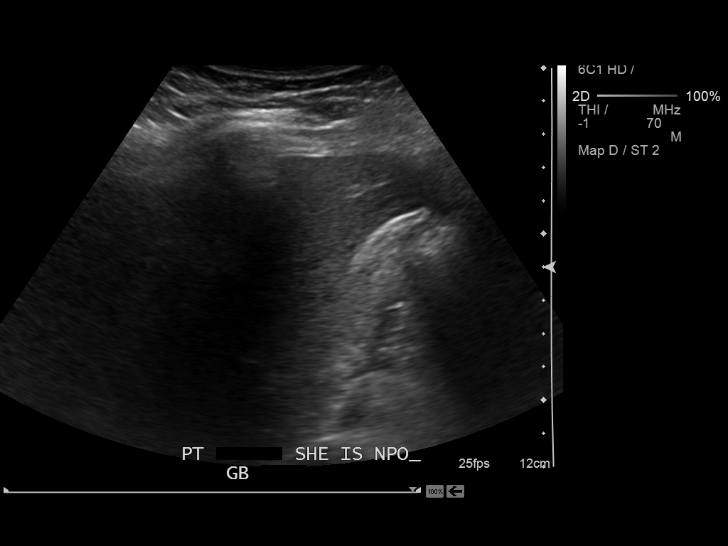
[im 37/59]
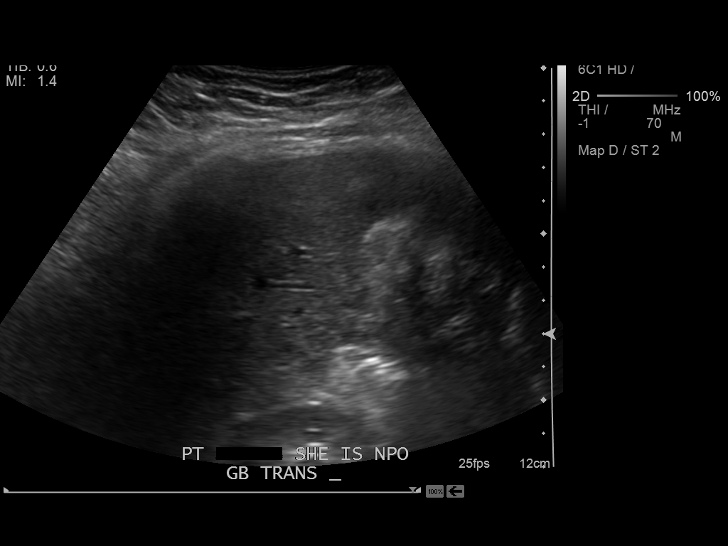
[im 39/59]
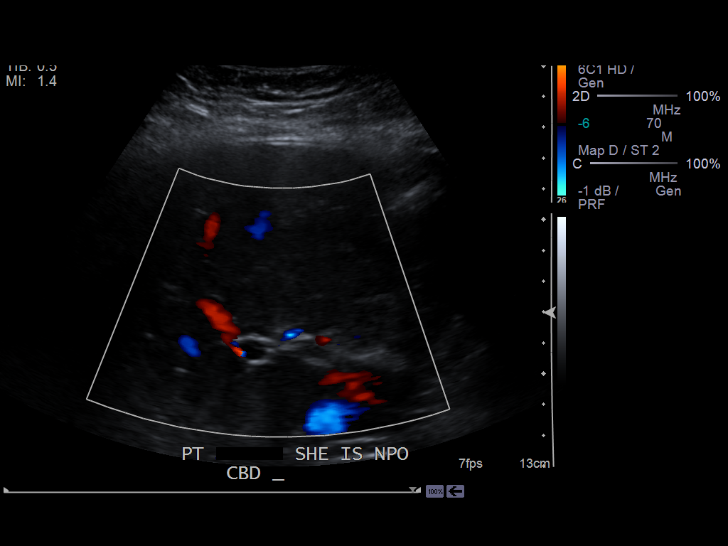
[im 44/59]
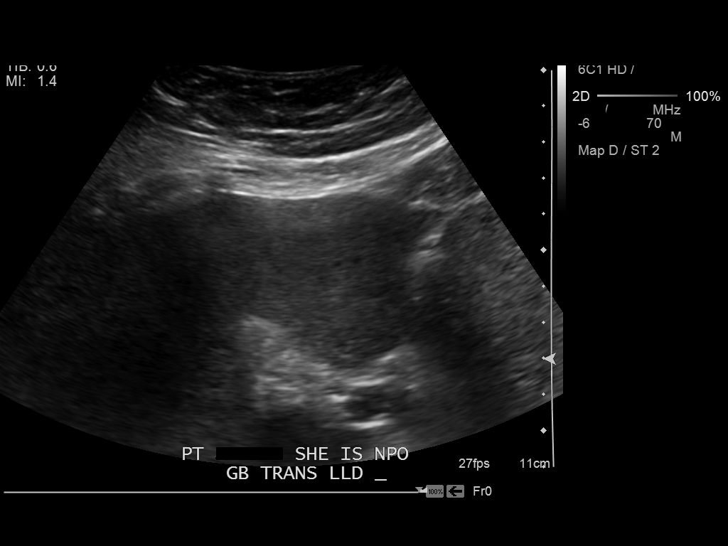
[im 49/59]
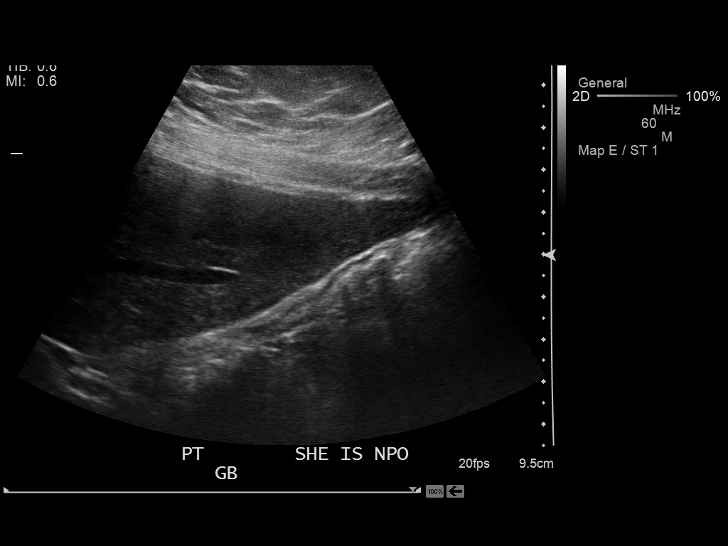
[im 54/59]
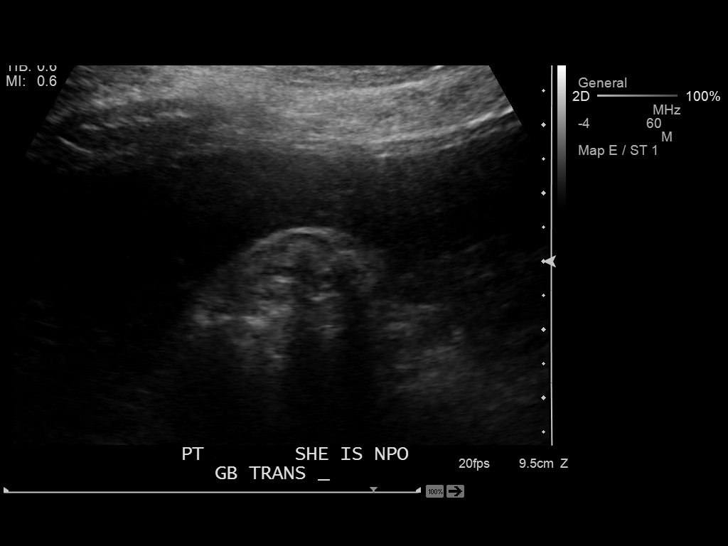
[im 59/59]
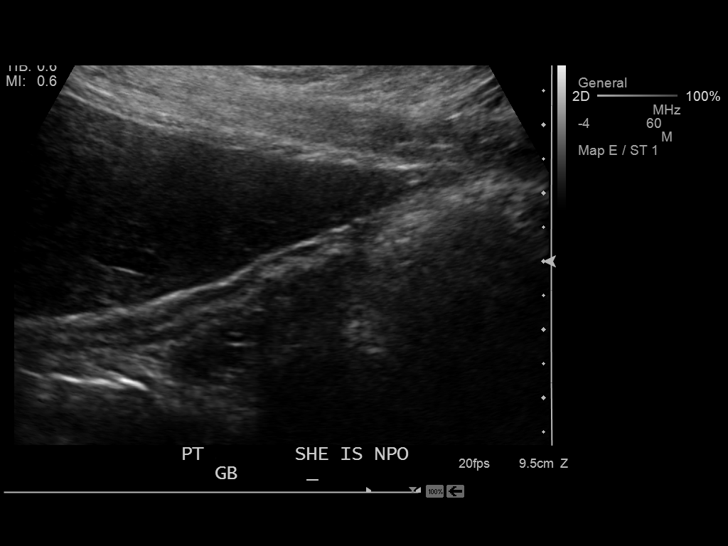

[14 of 25 positions shown; findings below may reference images not displayed]

FINDINGS: Gallbladder:

The gallbladder is not definitely visualized and most likely
contracted. In the region of the gallbladder fossa, there is
hyperechoic areas which most likely represent bowel as no gallstones
were identified on the 11/03/2013 study. There is no evidence of
free fluid or focal pain in this area.

Common bile duct:

Diameter: 3.3 mm. There is no evidence of intrahepatic or
extrahepatic biliary dilatation.

Liver:

No focal lesion identified. Within normal limits in parenchymal
echogenicity.
IMPRESSION: Gallbladder not definitely visualized and most likely contracted.
There are hyperechoic areas in the region of the gallbladder fossa
which are felt to most likely represent bowel rather then a
gallbladder filled with stones as no cholelithiasis or identified on
the 11/03/2013 study.

No other significant abnormalities.

## 2024-02-23 ENCOUNTER — Emergency Department
Admission: EM | Admit: 2024-02-23 | Discharge: 2024-02-23 | Disposition: A | Discharge status: Home / Self Care | Payer: Self-pay | Attending: Emergency Medicine | Admitting: Emergency Medicine

## 2024-02-23 ENCOUNTER — Encounter: Payer: Self-pay | Admitting: Intensive Care

## 2024-02-23 ENCOUNTER — Other Ambulatory Visit: Payer: Self-pay

## 2024-02-23 DIAGNOSIS — N390 Urinary tract infection, site not specified: Secondary | ICD-10-CM | POA: Insufficient documentation

## 2024-02-23 LAB — CBC
HCT: 43.4 % (ref 36.0–46.0)
Hemoglobin: 14.8 g/dL (ref 12.0–15.0)
MCH: 29.1 pg (ref 26.0–34.0)
MCHC: 34.1 g/dL (ref 30.0–36.0)
MCV: 85.3 fL (ref 80.0–100.0)
Platelets: 419 K/uL — ABNORMAL HIGH (ref 150–400)
RBC: 5.09 MIL/uL (ref 3.87–5.11)
RDW: 12.6 % (ref 11.5–15.5)
WBC: 5 K/uL (ref 4.0–10.5)
nRBC: 0 % (ref 0.0–0.2)

## 2024-02-23 LAB — URINALYSIS, ROUTINE W REFLEX MICROSCOPIC
Bilirubin Urine: NEGATIVE
Glucose, UA: NEGATIVE mg/dL
Ketones, ur: NEGATIVE mg/dL
Nitrite: NEGATIVE
Protein, ur: 30 mg/dL — AB
RBC / HPF: >50 RBC/hpf (ref 0–5)
Specific Gravity, Urine: 1.005 (ref 1.005–1.030)
pH: 8 (ref 5.0–8.0)

## 2024-02-23 LAB — COMPREHENSIVE METABOLIC PANEL WITH GFR
ALT: 18 U/L (ref 0–44)
AST: 18 U/L (ref 15–41)
Albumin: 3.9 g/dL (ref 3.5–5.0)
Alkaline Phosphatase: 58 U/L (ref 38–126)
Anion gap: 10 (ref 5–15)
BUN: 12 mg/dL (ref 6–20)
CO2: 21 mmol/L — ABNORMAL LOW (ref 22–32)
Calcium: 8.9 mg/dL (ref 8.9–10.3)
Chloride: 104 mmol/L (ref 98–111)
Creatinine, Ser: 0.67 mg/dL (ref 0.44–1.00)
GFR, Estimated: >60 mL/min (ref >60)
Glucose, Bld: 112 mg/dL — ABNORMAL HIGH (ref 70–99)
Potassium: 3.7 mmol/L (ref 3.5–5.1)
Sodium: 135 mmol/L (ref 135–145)
Total Bilirubin: 0.8 mg/dL (ref 0.0–1.2)
Total Protein: 7.3 g/dL (ref 6.5–8.1)

## 2024-02-23 LAB — PREGNANCY, URINE: Preg Test, Ur: NEGATIVE

## 2024-02-23 LAB — LIPASE, BLOOD: Lipase: 27 U/L (ref 11–51)

## 2024-02-23 MED ORDER — ONDANSETRON 4 MG PO TBDP: 4.0000 mg | ORAL_TABLET | Freq: Three times a day (TID) | ORAL | 0 refills | Status: AC | PRN

## 2024-02-23 MED ORDER — HYDROCODONE-ACETAMINOPHEN 5-325 MG PO TABS: 1.0000 | ORAL_TABLET | Freq: Three times a day (TID) | ORAL | 0 refills | Status: AC | PRN

## 2024-02-23 MED ORDER — CIPROFLOXACIN HCL 500 MG PO TABS
500.0000 mg | ORAL_TABLET | Freq: Two times a day (BID) | ORAL | 0 refills | Status: AC
Start: 2024-02-23 — End: 2024-03-04

## 2024-02-23 MED ORDER — SODIUM CHLORIDE 0.9 % IV SOLN
1.0000 g | Freq: Once | INTRAVENOUS | Status: AC
  Administered 2024-02-23: 1 g via INTRAVENOUS
  Filled 2024-02-23: qty 10

## 2024-02-23 MED ORDER — SODIUM CHLORIDE 0.9 % IV BOLUS
1000.0000 mL | Freq: Once | INTRAVENOUS | Status: AC
  Administered 2024-02-23: 1000 mL via INTRAVENOUS

## 2024-02-23 MED ORDER — KETOROLAC TROMETHAMINE 15 MG/ML IJ SOLN
15.0000 mg | Freq: Once | INTRAMUSCULAR | Status: AC
Start: 2024-02-23 — End: 2024-02-23
  Administered 2024-02-23: 15 mg via INTRAVENOUS
  Filled 2024-02-23: qty 1

## 2024-02-23 MED ORDER — ONDANSETRON HCL 4 MG/2ML IJ SOLN
4.0000 mg | Freq: Once | INTRAMUSCULAR | Status: AC
Start: 2024-02-23 — End: 2024-02-23
  Administered 2024-02-23: 4 mg via INTRAVENOUS
  Filled 2024-02-23: qty 2

## 2024-02-23 NOTE — ED Triage Notes (Signed)
 Patient c/o right sided, sharp abdominal pain with nausea since last night.  Denies diarrhea

## 2024-02-23 NOTE — ED Notes (Signed)
 Pt stating she just had a bout of emesis. Pain started in front right side of stomach and now is wrapping around to the back. Pt stating has had gallbladder removed, still has appendix. Family hx of kidney stones however pt has never had kidney stone before.

## 2024-02-23 NOTE — Discharge Instructions (Signed)
 Please take antibiotics as prescribed.  Use Zofran  as needed for nausea.  Make sure you are drinking lots of fluids.  Return to the ER for any fevers, increased pain, nausea or vomiting, worsening symptoms or any urgent changes in your health.

## 2024-02-23 NOTE — ED Provider Notes (Signed)
 [place] EMERGENCY DEPARTMENT AT [facility] Provider Note   CSN: [id number] Arrival date & time: 02/23/24  9072     Patient presents with: Abdominal Pain   Carly [name] is a 33 y.o. female.  Presents to the Emergency Department evaluation of lower abdominal pain.  She describes some sharp lower abdominal pain and discomfort that has moved upwards into her right flank and she has had some nausea with 1 episode of vomiting.  Pain is moderate.  She has not had a medications for her symptoms.  She denies any fevers or chills.  Has had a history of UTI several years ago.  Denies any urinary frequency or burning      Prior to Admission medications   Medication Sig Start Date End Date Taking? Authorizing Provider  ciprofloxacin  (CIPRO ) 500 MG tablet Take 1 tablet (500 mg total) by mouth 2 (two) times daily for 10 days. 02/23/24 03/04/24 Yes Charlene Debby BROCKS, PA-C  HYDROcodone -acetaminophen  (NORCO/VICODIN) 5-325 MG tablet Take 1 tablet by mouth every 8 (eight) hours as needed for severe pain (pain score 7-10). 02/23/24  Yes Charlene Debby BROCKS, PA-C  ondansetron  (ZOFRAN -ODT) 4 MG disintegrating tablet Take 1 tablet (4 mg total) by mouth every 8 (eight) hours as needed for nausea or vomiting. 02/23/24  Yes Charlene Debby BROCKS, PA-C  ibuprofen  (ADVIL ,MOTRIN ) 600 MG tablet Take 1 tablet (600 mg total) by mouth every 6 (six) hours. 03/17/15   Schermerhorn, Debby PARAS, MD  oxyCODONE -acetaminophen  (PERCOCET/ROXICET) 5-325 MG per tablet Take 1 tablet by mouth every 6 (six) hours as needed for moderate pain. 03/17/15   Schermerhorn, Debby PARAS, MD  Prenatal Vit-Fe Fumarate-FA (PRENATAL MULTIVITAMIN) TABS tablet Take 1 tablet by mouth daily.    [provider]    Allergies: Patient has no known allergies.    Review of Systems  Updated Vital Signs BP 132/82 (BP Location: Left Arm)   Pulse 78   Temp 98 F (36.7 C) (Oral)   Resp 16   Ht 5' 4 (1.626 m)   Wt 102.1 kg   LMP 02/09/2024  (Within Days)   SpO2 98%   BMI 38.62 kg/m   Physical Exam Constitutional:      Appearance: She is well-developed.  HENT:     Head: Normocephalic and atraumatic.  Eyes:     Conjunctiva/sclera: Conjunctivae normal.  Cardiovascular:     Rate and Rhythm: Normal rate and regular rhythm.     Pulses: Normal pulses.     Heart sounds: Normal heart sounds.  Pulmonary:     Effort: Pulmonary effort is normal. No respiratory distress.  Abdominal:     General: There is no distension.     Tenderness: There is no abdominal tenderness. There is no right CVA tenderness, left CVA tenderness or guarding.  Musculoskeletal:        General: Normal range of motion.     Cervical back: Normal range of motion.  Skin:    General: Skin is warm.     Findings: No rash.  Neurological:     General: No focal deficit present.     Mental Status: She is alert and oriented to person, place, and time.  Psychiatric:        Behavior: Behavior normal.        Thought Content: Thought content normal.     (all labs ordered are listed, but only abnormal results are displayed) Labs Reviewed  COMPREHENSIVE METABOLIC PANEL WITH GFR - Abnormal; Notable for the following components:  Result Value   CO2 21 (*)    Glucose, Bld 112 (*)    All other components within normal limits  CBC - Abnormal; Notable for the following components:   Platelets 419 (*)    All other components within normal limits  URINALYSIS, ROUTINE W REFLEX MICROSCOPIC - Abnormal; Notable for the following components:   Color, Urine YELLOW (*)    APPearance HAZY (*)    Hgb urine dipstick LARGE (*)    Protein, ur 30 (*)    Leukocytes,Ua TRACE (*)    Bacteria, UA MANY (*)    All other components within normal limits  URINE CULTURE  LIPASE, BLOOD  PREGNANCY, URINE    EKG: None  Radiology: No results found.   Procedures   Medications Ordered in the ED  sodium chloride  0.9 % bolus 1,000 mL (1,000 mLs Intravenous New Bag/Given  02/23/24 1045)  ketorolac  (TORADOL ) 15 MG/ML injection 15 mg (15 mg Intravenous Given 02/23/24 1047)  ondansetron  (ZOFRAN ) injection 4 mg (4 mg Intravenous Given 02/23/24 1047)  cefTRIAXone  (ROCEPHIN ) 1 g in sodium chloride  0.9 % 100 mL IVPB (1 g Intravenous New Bag/Given 02/23/24 1049)                                    Medical Decision Making Amount and/or Complexity of Data Reviewed Labs: ordered.  Risk Prescription drug management.   33 year old female with lower abdominal pain discomfort moving into the right flank.  Reported some nausea and vomiting that has resolved.  Mostly complaining of severe pain in the right flank region.  CBC normal show no elevated white count.  CMP normal.  Pregnancy test negative.  Urinalysis consistent with UTI as there is positive leukocytes, nitrites, WBCs and blood along with bacteria present.  Her vital signs are stable, afebrile nontachycardic.  She is given some IV fluids, 1 g of Rocephin , 15 mg of Toradol  and 4 mg of Zofran  through IV.  Pain has resolved.  She is currently feeling well with no pain nausea or vomiting.  She will be discharged home with Cipro  for 10 days.  She is given some Zofran  and Norco to take as needed if symptoms persist.  She is given strict return precautions understands signs symptoms return to ED for. Final diagnoses:  Complicated UTI (urinary tract infection)    ED Discharge Orders          Ordered    ciprofloxacin  (CIPRO ) 500 MG tablet  2 times daily        02/23/24 1127    ondansetron  (ZOFRAN -ODT) 4 MG disintegrating tablet  Every 8 hours PRN        02/23/24 1127    HYDROcodone -acetaminophen  (NORCO/VICODIN) 5-325 MG tablet  Every 8 hours PRN        02/23/24 1127               Mykale Gandolfo C, PA-C 02/23/24 1131    Dorothyann Drivers, MD 02/23/24 1506

## 2024-02-24 LAB — URINE CULTURE

## 2024-02-25 ENCOUNTER — Other Ambulatory Visit: Payer: Self-pay | Admitting: Nephrology

## 2024-02-25 DIAGNOSIS — R310 Gross hematuria: Secondary | ICD-10-CM

## 2024-02-25 DIAGNOSIS — R109 Unspecified abdominal pain: Secondary | ICD-10-CM

## 2024-02-27 ENCOUNTER — Ambulatory Visit
Admission: RE | Admit: 2024-02-27 | Discharge: 2024-02-27 | Disposition: A | Discharge status: Home / Self Care | Payer: Self-pay | Source: Ambulatory Visit | Attending: Nephrology | Admitting: Nephrology

## 2024-02-27 ENCOUNTER — Other Ambulatory Visit: Payer: Self-pay | Admitting: Nephrology

## 2024-02-27 DIAGNOSIS — R109 Unspecified abdominal pain: Secondary | ICD-10-CM

## 2024-02-27 DIAGNOSIS — R310 Gross hematuria: Secondary | ICD-10-CM | POA: Insufficient documentation

## 2024-03-12 ENCOUNTER — Other Ambulatory Visit: Payer: Self-pay | Admitting: Urology

## 2024-03-12 DIAGNOSIS — N200 Calculus of kidney: Secondary | ICD-10-CM

## 2024-03-13 ENCOUNTER — Ambulatory Visit
Admission: RE | Admit: 2024-03-13 | Discharge: 2024-03-13 | Disposition: A | Discharge status: Home / Self Care | Payer: Self-pay | Source: Ambulatory Visit | Attending: Urology | Admitting: Urology

## 2024-03-13 ENCOUNTER — Ambulatory Visit (INDEPENDENT_AMBULATORY_CARE_PROVIDER_SITE_OTHER): Payer: Self-pay | Admitting: Urology

## 2024-03-13 VITALS — BP 153/93 | HR 89 | Ht 63.0 in | Wt 225.0 lb

## 2024-03-13 DIAGNOSIS — N200 Calculus of kidney: Secondary | ICD-10-CM

## 2024-03-13 DIAGNOSIS — N201 Calculus of ureter: Secondary | ICD-10-CM

## 2024-03-13 LAB — URINALYSIS, COMPLETE
Bilirubin, UA: NEGATIVE
Glucose, UA: NEGATIVE
Ketones, UA: NEGATIVE
Leukocytes,UA: NEGATIVE
Nitrite, UA: NEGATIVE
Protein,UA: NEGATIVE
RBC, UA: NEGATIVE
Specific Gravity, UA: 1.015 (ref 1.005–1.030)
Urobilinogen, Ur: 0.2 mg/dL (ref 0.2–1.0)
pH, UA: 7 (ref 5.0–7.5)

## 2024-03-13 LAB — MICROSCOPIC EXAMINATION: Epithelial Cells (non renal): >10 /HPF — AB (ref 0–10)

## 2024-03-13 NOTE — H&P (View-Only) (Signed)
 03/13/2024 9:07 PM   Carly [name] 21-Apr-1991 [id number]  Referring provider: Lateef, Munsoor, MD [address] [name],  [place] [address]  Urological history: 1. None  Chief Complaint  Patient presents with   New Patient (Initial Visit)   Establish Care   Nephrolithiasis    HPI: Carly [name] is a 33 y.o. woman who presents today for kidney stones.    Previous records reviewed.   3 weeks ago she had the sudden onset of very sharp right waist pain associated with nausea and 1 episode of vomiting.  And when she googled her symptoms it mention appendicitis, so she went to the [facility] ED.   Her serum creatinine was normal, her WBC was normal at 5.0, UA was positive for greater than 50 RBCs, 21-50 WBCs, many bacteria with 6-10 squames.   Her vital signs are stable and she was afebrile in the ER.   She was diagnosed with a UTI and she was given 1 g Rocephin , 15 mg of Toradol  and 4 mg of Zofran  through IV and she was discharged on Cipro  for 10 days, Zofran  and Norco.     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  A CT renal stone study performed on February 27, 2024 noted a right ureteropelvic junction stone measuring about 5 mm in size with no hydronephrosis.  She also had a 5 mm stone present in the left upper pole and a 4 mm stone present in the left lower pole.  Urine culture grew out multiple species.  UA positive for greater than 10 epithelial cells and moderate bacteria.    KUB linear 7 mm linear stone at the level of L3 on the right and two renal stones in the left kidney.     Lab Results  Component Value Date   WBC 5.0 02/23/2024   HGB 14.8 02/23/2024   HCT 43.4 02/23/2024   MCV 85.3 02/23/2024   PLT 419 (H) 02/23/2024    Lab Results  Component Value Date   CREATININE 0.67 02/23/2024    PMH: Past Medical History:  Diagnosis Date   Anxiety     Surgical History: Past Surgical  History:  Procedure Laterality Date   CHOLECYSTECTOMY  October 2015   TUBAL LIGATION N/A 03/16/2015   Procedure: POST PARTUM TUBAL LIGATION;  Surgeon: Debby JINNY Dinsmore, MD;  Location: [facility] ORS;  Service: Gynecology;  Laterality: N/A;    Home Medications:  Allergies as of 03/13/2024   No Known Allergies      Medication List        Accurate as of March 13, 2024 11:59 PM. If you have any questions, ask your nurse or doctor.          HYDROcodone -acetaminophen  5-325 MG tablet Commonly known as: NORCO/VICODIN Take 1 tablet by mouth every 8 (eight) hours as needed for severe pain (pain score 7-10).   ibuprofen  600 MG tablet Commonly known as: ADVIL  Take 1 tablet (600 mg total) by mouth every 6 (six) hours.   ondansetron  4 MG disintegrating tablet Commonly known as: ZOFRAN -ODT Take 1 tablet (4 mg total) by mouth every 8 (eight) hours as needed for nausea or vomiting.   oxyCODONE -acetaminophen  5-325 MG tablet Commonly known as: PERCOCET/ROXICET Take 1 tablet by mouth every 6 (six) hours as needed for moderate pain.   prenatal multivitamin Tabs tablet Take 1 tablet by mouth daily.  Allergies: No Known Allergies  Family History: No family history on file.  Social History: See HPI for pertinent social history  ROS: Pertinent ROS in HPI  Physical Exam: BP (!) 153/93 (BP Location: Left Arm, Patient Position: Sitting, Cuff Size: Large)   Pulse 89   Ht 5' 3 (1.6 m)   Wt 225 lb (102.1 kg)   LMP 02/10/2024 (Exact Date)   SpO2 95%   BMI 39.86 kg/m   Constitutional:  Well nourished. Alert and oriented, No acute distress. HEENT: [place] AT, moist mucus membranes.  Trachea midline Cardiovascular: No clubbing, cyanosis, or edema. Respiratory: Normal respiratory effort, no increased work of breathing. Neurologic: Grossly intact, no focal deficits, moving all 4 extremities. Psychiatric: Normal mood and affect.  Laboratory Data: See EPIC and HPI  I have reviewed  the labs.   Pertinent Imaging: I have independently reviewed the films.  See HPI.    Assessment & Plan:    1. Right ureteral stone  - Urinalysis, Complete - CULTURE, URINE COMPREHENSIVE -the passage rate for stones 4 mm to 6 mm in size is 15 to 22 days and 50% of them will require a surgical intervention -she is not wanting to experience another episode of intense pain, so she would like to pursue definitive management - We discussed various treatment options for urolithiasis including observation with or without medical expulsive therapy, shockwave lithotripsy (SWL), ureteroscopy and laser lithotripsy with stent placement and given stone size, stone location and stone density    -explained that ESWL has a lower stone-free rate in a single procedure, but also a lower complication rate compared to ureteroscopy, and avoids a stent and associated stent related symptoms. Possible complications include renal hematoma, steinstrasse, and the need for additional treatment. -explained that ureteroscopy with laser lithotripsy and stent placement has a higher stone-free rate than SWL in a single procedure; however, there is an increased complication rate, including possible infection, ureteral injury, bleeding, and stent-related morbidity. Common stent-related symptoms include dysuria, urgency/frequency, and flank pain. -she would like to be scheduled for ESWL as it is less invasive and understands that it is a lower stone-free rate in a single procefure -Explained to the patient that with ESWL, shock waves are focused on the stone using X-rays to pinpoint the stone.  The shock waves are fired repeatedly which usually causes the stone to break into small pieces which pass out in the urine over the next few weeks.  They will go home that day and may be able to resume normal activities in three days.  They should be given a strainer and they need to collect any fragments/sediments that they pass for analysis.    Risks involved with the procedure consist of bruising of the skin and kidney, possible long-term kidney damage, development of HTN, damage to the bowel, spleen, liver, pancreas, female organs or lungs, hematuria, hematuria serious enough to require transfusion or surgical repair, formation of a hematoma, infection or sepsis.  If the stone is too large or dense, it may not break apart or break apart in large pieces and cause Steinstrasse.  If this happens, it would result in the need for another procedure (likely URS/LL/stent placement).  IV sedation is typically used, but in rare instances general anesthesia is used with the risk of irregular heart beat, irregular BP, stroke, MI, CVA, paralysis, coma and/or death.     Return for Right ESWL .  These notes generated with voice recognition software. I apologize for typographical errors.  Sahmir Weatherbee  HELON RIGGERS  [facility] [address]  [address] [place], [place] [address] [phone]     ESWL ORDER FORM  Expected date of procedure: 03/19/2024  Surgeon: Redell Burnet, MD  Post op standing: 2-4wk follow up w/KUB prior  Anticoagulation/Aspirin/NSAID standing order: Hold all 72 hours prior  Anesthesia standing order: MAC  VTE standing: SCD's  Dx: Right Ureteral Stone  Procedure: right Extracorporeal shock wave lithotripsy  CPT : 50590  Standing Order Set:   *NPO after mn, KUB, pregnancy urine test   *NS 130ml/hr, Keflex  500mg  PO, Benadryl  25mg  PO, Valium  10mg  PO, Zofran  4mg  IV  Medications if other than standing orders:   NONE

## 2024-03-13 NOTE — Progress Notes (Signed)
 03/13/2024 9:07 PM   Carly [name] 21-Apr-1991 [id number]  Referring provider: Lateef, Munsoor, MD [address] [name],  [name] [address]  Urological history: 1. None  Chief Complaint  Patient presents with   New Patient (Initial Visit)   Establish Care   Nephrolithiasis    HPI: Carly [name] is a 33 y.o. woman who presents today for kidney stones.    Previous records reviewed.   3 weeks ago she had the sudden onset of very sharp right waist pain associated with nausea and 1 episode of vomiting.  And when she googled her symptoms it mention appendicitis, so she went to the [facility] ED.   Her serum creatinine was normal, her WBC was normal at 5.0, UA was positive for greater than 50 RBCs, 21-50 WBCs, many bacteria with 6-10 squames.   Her vital signs are stable and she was afebrile in the ER.   She was diagnosed with a UTI and she was given 1 g Rocephin , 15 mg of Toradol  and 4 mg of Zofran  through IV and she was discharged on Cipro  for 10 days, Zofran  and Norco.     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  A CT renal stone study performed on February 27, 2024 noted a right ureteropelvic junction stone measuring about 5 mm in size with no hydronephrosis.  She also had a 5 mm stone present in the left upper pole and a 4 mm stone present in the left lower pole.  Urine culture grew out multiple species.  UA positive for greater than 10 epithelial cells and moderate bacteria.    KUB linear 7 mm linear stone at the level of L3 on the right and two renal stones in the left kidney.     Lab Results  Component Value Date   WBC 5.0 02/23/2024   HGB 14.8 02/23/2024   HCT 43.4 02/23/2024   MCV 85.3 02/23/2024   PLT 419 (H) 02/23/2024    Lab Results  Component Value Date   CREATININE 0.67 02/23/2024    PMH: Past Medical History:  Diagnosis Date   Anxiety     Surgical History: Past Surgical  History:  Procedure Laterality Date   CHOLECYSTECTOMY  October 2015   TUBAL LIGATION N/A 03/16/2015   Procedure: POST PARTUM TUBAL LIGATION;  Surgeon: Debby JINNY Dinsmore, MD;  Location: [facility] ORS;  Service: Gynecology;  Laterality: N/A;    Home Medications:  Allergies as of 03/13/2024   No Known Allergies      Medication List        Accurate as of March 13, 2024 11:59 PM. If you have any questions, ask your nurse or doctor.          HYDROcodone -acetaminophen  5-325 MG tablet Commonly known as: NORCO/VICODIN Take 1 tablet by mouth every 8 (eight) hours as needed for severe pain (pain score 7-10).   ibuprofen  600 MG tablet Commonly known as: ADVIL  Take 1 tablet (600 mg total) by mouth every 6 (six) hours.   ondansetron  4 MG disintegrating tablet Commonly known as: ZOFRAN -ODT Take 1 tablet (4 mg total) by mouth every 8 (eight) hours as needed for nausea or vomiting.   oxyCODONE -acetaminophen  5-325 MG tablet Commonly known as: PERCOCET/ROXICET Take 1 tablet by mouth every 6 (six) hours as needed for moderate pain.   prenatal multivitamin Tabs tablet Take 1 tablet by mouth daily.  Allergies: No Known Allergies  Family History: No family history on file.  Social History: See HPI for pertinent social history  ROS: Pertinent ROS in HPI  Physical Exam: BP (!) 153/93 (BP Location: Left Arm, Patient Position: Sitting, Cuff Size: Large)   Pulse 89   Ht 5' 3 (1.6 m)   Wt 225 lb (102.1 kg)   LMP 02/10/2024 (Exact Date)   SpO2 95%   BMI 39.86 kg/m   Constitutional:  Well nourished. Alert and oriented, No acute distress. HEENT: [place] AT, moist mucus membranes.  Trachea midline Cardiovascular: No clubbing, cyanosis, or edema. Respiratory: Normal respiratory effort, no increased work of breathing. Neurologic: Grossly intact, no focal deficits, moving all 4 extremities. Psychiatric: Normal mood and affect.  Laboratory Data: See EPIC and HPI  I have reviewed  the labs.   Pertinent Imaging: I have independently reviewed the films.  See HPI.    Assessment & Plan:    1. Right ureteral stone  - Urinalysis, Complete - CULTURE, URINE COMPREHENSIVE -the passage rate for stones 4 mm to 6 mm in size is 15 to 22 days and 50% of them will require a surgical intervention -she is not wanting to experience another episode of intense pain, so she would like to pursue definitive management - We discussed various treatment options for urolithiasis including observation with or without medical expulsive therapy, shockwave lithotripsy (SWL), ureteroscopy and laser lithotripsy with stent placement and given stone size, stone location and stone density    -explained that ESWL has a lower stone-free rate in a single procedure, but also a lower complication rate compared to ureteroscopy, and avoids a stent and associated stent related symptoms. Possible complications include renal hematoma, steinstrasse, and the need for additional treatment. -explained that ureteroscopy with laser lithotripsy and stent placement has a higher stone-free rate than SWL in a single procedure; however, there is an increased complication rate, including possible infection, ureteral injury, bleeding, and stent-related morbidity. Common stent-related symptoms include dysuria, urgency/frequency, and flank pain. -she would like to be scheduled for ESWL as it is less invasive and understands that it is a lower stone-free rate in a single procefure -Explained to the patient that with ESWL, shock waves are focused on the stone using X-rays to pinpoint the stone.  The shock waves are fired repeatedly which usually causes the stone to break into small pieces which pass out in the urine over the next few weeks.  They will go home that day and may be able to resume normal activities in three days.  They should be given a strainer and they need to collect any fragments/sediments that they pass for analysis.    Risks involved with the procedure consist of bruising of the skin and kidney, possible long-term kidney damage, development of HTN, damage to the bowel, spleen, liver, pancreas, female organs or lungs, hematuria, hematuria serious enough to require transfusion or surgical repair, formation of a hematoma, infection or sepsis.  If the stone is too large or dense, it may not break apart or break apart in large pieces and cause Steinstrasse.  If this happens, it would result in the need for another procedure (likely URS/LL/stent placement).  IV sedation is typically used, but in rare instances general anesthesia is used with the risk of irregular heart beat, irregular BP, stroke, MI, CVA, paralysis, coma and/or death.     Return for Right ESWL .  These notes generated with voice recognition software. I apologize for typographical errors.  Carly [name]  C[name]  [facility] [address]  [address] [place], [name] [address] [phone]     ESWL ORDER FORM  Expected date of procedure: 03/19/2024  Surgeon: Redell Burnet, MD  Post op standing: 2-4wk follow up w/KUB prior  Anticoagulation/Aspirin/NSAID standing order: Hold all 72 hours prior  Anesthesia standing order: MAC  VTE standing: SCD's  Dx: Right Ureteral Stone  Procedure: right Extracorporeal shock wave lithotripsy  CPT : 50590  Standing Order Set:   *NPO after mn, KUB, pregnancy urine test   *NS 130ml/hr, Keflex  500mg  PO, Benadryl  25mg  PO, Valium  10mg  PO, Zofran  4mg  IV  Medications if other than standing orders:   NONE

## 2024-03-16 ENCOUNTER — Encounter: Payer: Self-pay | Admitting: Urology

## 2024-03-17 ENCOUNTER — Other Ambulatory Visit: Payer: Self-pay

## 2024-03-17 DIAGNOSIS — N201 Calculus of ureter: Secondary | ICD-10-CM

## 2024-03-17 NOTE — Progress Notes (Signed)
    [place] ESWL POSTING SHEET        Patient Name: Carly [name]  DOB: 02-Jul-1991  MRN: [id number]  Surgeon:  Redell Burnet, MD  Diagnosis:  Right Ureteral Stone  CPT: 49409  ESWL DATE: 03/19/2024  ESWL TIME: 0730  Special Needs/Requirements: None       Cardiac/Medical/Pulmonary Clearance needed: No       Form Faxed to [facility]- [phone] Date:   Date: 03/17/24       Form Faxed to [facility]- [phone]  Date:  Date: 03/17/24           Copy Made for Insurance PA:  Date: 03/17/24       Orders Entered in to Epic:  Date: 03/17/24

## 2024-03-17 NOTE — Progress Notes (Signed)
 ESWL ORDER FORM  Expected date of procedure: 03/19/2024  Surgeon: Redell Burnet, MD  Post op standing: 2-4wk follow up w/KUB prior  Anticoagulation/Aspirin/NSAID standing order: Hold all 72 hours prior  Anesthesia standing order: MAC  VTE standing: SCD's  Dx: Right Ureteral Stone  Procedure: right Extracorporeal shock wave lithotripsy  CPT : 50590  Standing Order Set:   *NPO after mn, KUB, pregnancy urine test   *NS 167ml/hr, Keflex  500mg  PO, Benadryl  25mg  PO, Valium  10mg  PO, Zofran  4mg  IV  Medications if other than standing orders:   NONE

## 2024-03-19 ENCOUNTER — Ambulatory Visit: Payer: Self-pay | Admitting: Urology

## 2024-03-19 ENCOUNTER — Ambulatory Visit
Admission: RE | Admit: 2024-03-19 | Discharge: 2024-03-19 | Disposition: A | Discharge status: Home / Self Care | Payer: Self-pay | Attending: Urology | Admitting: Urology

## 2024-03-19 ENCOUNTER — Other Ambulatory Visit: Payer: Self-pay

## 2024-03-19 ENCOUNTER — Encounter
Admission: RE | Disposition: A | Discharge status: Home / Self Care | Payer: Self-pay | Source: Home / Self Care | Attending: Urology

## 2024-03-19 ENCOUNTER — Ambulatory Visit: Payer: Self-pay

## 2024-03-19 ENCOUNTER — Encounter: Payer: Self-pay | Admitting: Urology

## 2024-03-19 DIAGNOSIS — N201 Calculus of ureter: Secondary | ICD-10-CM | POA: Insufficient documentation

## 2024-03-19 HISTORY — PX: EXTRACORPOREAL SHOCK WAVE LITHOTRIPSY: SHX1557

## 2024-03-19 LAB — CULTURE, URINE COMPREHENSIVE

## 2024-03-19 LAB — POCT PREGNANCY, URINE: Preg Test, Ur: NEGATIVE

## 2024-03-19 SURGERY — LITHOTRIPSY, ESWL
Anesthesia: Moderate Sedation | Laterality: Right

## 2024-03-19 MED ORDER — DIPHENHYDRAMINE HCL 25 MG PO CAPS
25.0000 mg | ORAL_CAPSULE | ORAL | Status: AC
  Administered 2024-03-19: 25 mg via ORAL

## 2024-03-19 MED ORDER — HYDROCODONE-ACETAMINOPHEN 5-325 MG PO TABS
1.0000 | ORAL_TABLET | Freq: Once | ORAL | Status: AC
  Administered 2024-03-19: 1 via ORAL

## 2024-03-19 MED ORDER — ONDANSETRON HCL 4 MG/2ML IJ SOLN
4.0000 mg | Freq: Once | INTRAMUSCULAR | Status: AC
  Administered 2024-03-19: 4 mg via INTRAVENOUS

## 2024-03-19 MED ORDER — DIAZEPAM 5 MG PO TABS
ORAL_TABLET | ORAL | Status: AC
  Filled 2024-03-19: qty 2

## 2024-03-19 MED ORDER — CEPHALEXIN 500 MG PO CAPS
500.0000 mg | ORAL_CAPSULE | Freq: Once | ORAL | Status: AC
  Administered 2024-03-19: 500 mg via ORAL

## 2024-03-19 MED ORDER — HYDROCODONE-ACETAMINOPHEN 5-325 MG PO TABS: 1.0000 | ORAL_TABLET | Freq: Four times a day (QID) | ORAL | 0 refills | Status: AC | PRN

## 2024-03-19 MED ORDER — SODIUM CHLORIDE 0.9 % IV SOLN: INTRAVENOUS | Status: DC

## 2024-03-19 MED ORDER — HYDROCODONE-ACETAMINOPHEN 5-325 MG PO TABS
ORAL_TABLET | ORAL | Status: AC
  Filled 2024-03-19: qty 1

## 2024-03-19 MED ORDER — CEPHALEXIN 500 MG PO CAPS
ORAL_CAPSULE | ORAL | Status: AC
  Filled 2024-03-19: qty 1

## 2024-03-19 MED ORDER — AMOXICILLIN-POT CLAVULANATE 875-125 MG PO TABS: 1.0000 | ORAL_TABLET | Freq: Two times a day (BID) | ORAL | 0 refills | Status: AC

## 2024-03-19 MED ORDER — ONDANSETRON HCL 4 MG/2ML IJ SOLN
INTRAMUSCULAR | Status: AC
  Filled 2024-03-19: qty 2

## 2024-03-19 MED ORDER — DIPHENHYDRAMINE HCL 25 MG PO CAPS
ORAL_CAPSULE | ORAL | Status: AC
  Filled 2024-03-19: qty 1

## 2024-03-19 MED ORDER — DIAZEPAM 5 MG PO TABS
10.0000 mg | ORAL_TABLET | ORAL | Status: AC
  Administered 2024-03-19: 10 mg via ORAL

## 2024-03-19 MED ORDER — TAMSULOSIN HCL 0.4 MG PO CAPS: 0.4000 mg | ORAL_CAPSULE | Freq: Every day | ORAL | 0 refills | Status: AC

## 2024-03-19 NOTE — Brief Op Note (Signed)
 03/19/2024  8:48 AM  PATIENT:  Carly [name]  33 y.o. female  PRE-OPERATIVE DIAGNOSIS:  Right Ureteral Stone  POST-OPERATIVE DIAGNOSIS:  Same  PROCEDURE:  Procedure(s): LITHOTRIPSY, ESWL (Right)  SURGEON:  Surgeons and Role:    DEWAINE Francisca Redell JAYSON, MD - Primary  ANESTHESIA: Conscious Sedation  EBL:  None  Drains: None  Specimen: None  Findings:  5mm right proximal ureteral stone, appeared to smudge well 2. Tolerated SWL well  DISPO: Flomax , pain meds PRN, RTC 2 weeks KUB  Redell Francisca, MD 03/19/2024

## 2024-03-19 NOTE — Interval H&P Note (Signed)
 UROLOGY H&P UPDATE  Agree with prior H&P dated 8/29, 5mm right proximal ureteral stone  Cardiac: RRR Lungs: CTA bilaterally  Laterality: right Procedure: shockwave lithotripsy   Informed consent obtained, we specifically discussed the risks of bleeding/hematomaob, infection, post-operative pain, obstructive fragments, , need for additional procedures including URS.  Redell JAYSON Burnet, MD 03/19/2024

## 2024-03-20 ENCOUNTER — Encounter: Payer: Self-pay | Admitting: Urology

## 2024-03-25 ENCOUNTER — Encounter: Payer: Self-pay | Admitting: Urology

## 2024-04-09 ENCOUNTER — Ambulatory Visit: Payer: Self-pay | Admitting: Physician Assistant
